# Patient Record
Sex: Male | Born: 1975 | Race: Black or African American | Hispanic: No | Marital: Married | Smoking: Current every day smoker
Health system: Southern US, Community
[De-identification: ages and names within clinical notes are randomized; demographics above are authoritative.]

## PROBLEM LIST (undated history)

## (undated) DIAGNOSIS — M25559 Pain in unspecified hip: Secondary | ICD-10-CM

## (undated) DIAGNOSIS — M1990 Unspecified osteoarthritis, unspecified site: Secondary | ICD-10-CM

## (undated) DIAGNOSIS — F17200 Nicotine dependence, unspecified, uncomplicated: Secondary | ICD-10-CM

## (undated) DIAGNOSIS — I10 Essential (primary) hypertension: Secondary | ICD-10-CM

## (undated) HISTORY — PX: SHOULDER ARTHROSCOPY: SHX128

## (undated) HISTORY — DX: Nicotine dependence, unspecified, uncomplicated: F17.200

## (undated) HISTORY — PX: PELVIS DEBRIDEMENT: SHX2195

## (undated) HISTORY — PX: HIP FRACTURE SURGERY: SHX118

## (undated) HISTORY — DX: Pain in unspecified hip: M25.559

## (undated) HISTORY — PX: ORTHOPEDIC SURGERY: SHX850

## (undated) HISTORY — DX: Unspecified osteoarthritis, unspecified site: M19.90

---

## 1998-06-20 ENCOUNTER — Emergency Department (HOSPITAL_COMMUNITY): Admission: EM | Admit: 1998-06-20 | Discharge: 1998-06-20 | Payer: Self-pay | Admitting: Emergency Medicine

## 1998-06-20 ENCOUNTER — Encounter: Payer: Self-pay | Admitting: Emergency Medicine

## 2006-07-31 ENCOUNTER — Emergency Department (HOSPITAL_COMMUNITY): Admission: EM | Admit: 2006-07-31 | Discharge: 2006-07-31 | Payer: Self-pay | Admitting: Emergency Medicine

## 2006-10-07 ENCOUNTER — Ambulatory Visit (HOSPITAL_COMMUNITY): Admission: RE | Admit: 2006-10-07 | Discharge: 2006-10-07 | Payer: Self-pay | Admitting: Orthopaedic Surgery

## 2009-11-27 ENCOUNTER — Emergency Department (HOSPITAL_COMMUNITY): Admission: EM | Admit: 2009-11-27 | Discharge: 2009-11-27 | Payer: Self-pay | Admitting: Emergency Medicine

## 2010-07-04 LAB — CULTURE, ROUTINE-ABSCESS

## 2012-01-20 ENCOUNTER — Encounter (HOSPITAL_COMMUNITY): Payer: Self-pay | Admitting: *Deleted

## 2012-01-20 ENCOUNTER — Emergency Department (HOSPITAL_COMMUNITY): Payer: Self-pay

## 2012-01-20 ENCOUNTER — Emergency Department (HOSPITAL_COMMUNITY)
Admission: EM | Admit: 2012-01-20 | Discharge: 2012-01-20 | Disposition: A | Discharge status: Home / Self Care | Payer: Self-pay | Attending: Emergency Medicine | Admitting: Emergency Medicine

## 2012-01-20 DIAGNOSIS — M25469 Effusion, unspecified knee: Secondary | ICD-10-CM | POA: Insufficient documentation

## 2012-01-20 DIAGNOSIS — M25569 Pain in unspecified knee: Secondary | ICD-10-CM | POA: Insufficient documentation

## 2012-01-20 DIAGNOSIS — M25461 Effusion, right knee: Secondary | ICD-10-CM

## 2012-01-20 MED ORDER — DICLOFENAC SODIUM 75 MG PO TBEC: 75.0000 mg | DELAYED_RELEASE_TABLET | Freq: Two times a day (BID) | ORAL | Status: AC

## 2012-01-20 MED ORDER — HYDROCODONE-ACETAMINOPHEN 5-325 MG PO TABS: ORAL_TABLET | ORAL | Status: DC

## 2012-01-20 NOTE — ED Provider Notes (Signed)
History     CSN: [id number]  Arrival date & time 01/20/12  0917   First MD Initiated Contact with Patient 01/20/12 [phone]      Chief Complaint  Patient presents with  . Knee Pain    (Consider location/radiation/quality/duration/timing/severity/associated sxs/prior treatment) HPI Comments: Patient states that approximately 2 weeks ago he had a fall from a standing position and injured the right knee. Shortly after this fall he had swelling involving the right knee. The patient states that he has tried wraps and ice and over-the-counter medications with minimal success. The patient presents now for evaluation of his knee and in particular to rule out fracture. 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  The history is provided by the patient.    History reviewed. No pertinent past medical history.  Past Surgical History  Procedure Date  . Orthopedic surgery     No family history on file.  History  Substance Use Topics  . Smoking status: Current Every Day Smoker    Types: Cigarettes  . Smokeless tobacco: Not on file  . Alcohol Use: Yes     daily      Review of Systems  Constitutional: Negative for activity change.       All ROS Neg except as noted in HPI  HENT: Negative for nosebleeds and neck pain.   Eyes: Negative for photophobia and discharge.  Respiratory: Negative for cough, shortness of breath and wheezing.   Cardiovascular: Negative for chest pain and palpitations.  Gastrointestinal: Negative for abdominal pain and blood in stool.  Genitourinary: Negative for dysuria, frequency and hematuria.  Musculoskeletal: Positive for back pain and arthralgias.  Skin: Negative.   Neurological: Negative for dizziness, seizures and speech difficulty.  Psychiatric/Behavioral: Negative for hallucinations and confusion.    Allergies  Review  of patient's allergies indicates no known allergies.  Home Medications   Current Outpatient Rx  Name Route Sig Dispense Refill  . ASPIRIN-CAFFEINE 845-65 MG PO PACK Oral Take 1 Package by mouth every 8 (eight) hours as needed. Pain    . DICLOFENAC SODIUM 75 MG PO TBEC Oral Take 1 tablet (75 mg total) by mouth 2 (two) times daily. 12 tablet 0    Please take with food  . HYDROCODONE-ACETAMINOPHEN 5-325 MG PO TABS  1 or 2 po q4h prn pain 15 tablet 0    BP 142/95  Pulse 62  Temp 99 F (37.2 C) (Oral)  Resp 16  Ht 6' (1.829 m)  Wt 197 lb (89.359 kg)  BMI 26.72 kg/m2  SpO2 100%  Physical Exam  Nursing note and vitals reviewed. Constitutional: He is oriented to person, place, and time. He appears well-developed and well-nourished.  Non-toxic appearance.  HENT:  Head: Normocephalic.  Right Ear: Tympanic membrane and external ear normal.  Left Ear: Tympanic membrane and external ear normal.  Eyes: EOM and lids are normal. Pupils are equal, round, and reactive to light.  Neck: Normal range of motion. Neck supple. Carotid bruit is not present.  Cardiovascular: Normal rate, regular rhythm, normal heart sounds, intact distal pulses and normal pulses.   Pulmonary/Chest: Breath sounds normal. No respiratory distress.  Abdominal: Soft. Bowel sounds are normal. There is no tenderness. There is no guarding.  Musculoskeletal: Normal range of motion.       There is soreness with  range of motion of the right hip. There is an effusion of the right knee. The right knee is warm but not hot. There is no posterior mass appreciated. The anterior tibial tuberosity is intact. There is no deformity of the quadricep area. There is no tibia or fibula deformity appreciated. Dorsalis pedis pulses are symmetrical.  Lymphadenopathy:       Head (right side): No submandibular adenopathy present.       Head (left side): No submandibular adenopathy present.    He has no cervical adenopathy.  Neurological: He is  alert and oriented to person, place, and time. He has normal strength. No cranial nerve deficit or sensory deficit.       There is decreased dorsiflexion on the right. This is not new per the patient.  Skin: Skin is warm and dry.  Psychiatric: He has a normal mood and affect. His speech is normal.    ED Course  Procedures (including critical care time)  Labs Reviewed - No data to display Dg Knee Complete 4 Views Right  01/20/2012  *RADIOLOGY REPORT*  Clinical Data: Right knee pain, swelling.  RIGHT KNEE - COMPLETE 4+ VIEW  Comparison: None.  Findings: There is a moderate joint effusion. No acute bony abnormality.  Specifically, no fracture, subluxation, or dislocation.  Soft tissues are intact.  Joint spaces are maintained.  IMPRESSION: Moderate joint effusion. No acute bony abnormality.   Original Report Authenticated By: Cyndie Chime, M.D.      1. Knee effusion, right       MDM  I have reviewed nursing notes, vital signs, and all appropriate lab and imaging results for this patient. X-ray of the right knee reveals a moderate effusion, there is no bony abnormality appreciated. Patient is fitted with a knee immobilizer, prescription is given for Voltaren 75 mg 12 times daily with food. The patient is also given a prescription for Norco 5 mg #15 tablets to use for pain if needed. Patient is referred to orthopedics, for evaluation of this problem.       Kathie Dike, [place] 01/20/12 [phone]

## 2012-01-20 NOTE — ED Provider Notes (Signed)
Medical screening examination/treatment/procedure(s) were performed by non-physician practitioner and as supervising physician I was immediately available for consultation/collaboration.   Spiro Ausborn, MD 01/20/12 1535 

## 2012-01-20 NOTE — ED Notes (Signed)
Fell x 2 weeks ago. Pain and swelling to right knee, per pt.

## 2012-10-26 DIAGNOSIS — Z87828 Personal history of other (healed) physical injury and trauma: Secondary | ICD-10-CM | POA: Insufficient documentation

## 2012-10-26 DIAGNOSIS — F17200 Nicotine dependence, unspecified, uncomplicated: Secondary | ICD-10-CM | POA: Insufficient documentation

## 2012-10-26 DIAGNOSIS — M25559 Pain in unspecified hip: Secondary | ICD-10-CM | POA: Insufficient documentation

## 2012-10-26 DIAGNOSIS — Z8739 Personal history of other diseases of the musculoskeletal system and connective tissue: Secondary | ICD-10-CM | POA: Insufficient documentation

## 2012-10-26 DIAGNOSIS — Z79899 Other long term (current) drug therapy: Secondary | ICD-10-CM | POA: Insufficient documentation

## 2012-10-26 DIAGNOSIS — Z9889 Other specified postprocedural states: Secondary | ICD-10-CM | POA: Insufficient documentation

## 2012-10-27 ENCOUNTER — Emergency Department (HOSPITAL_COMMUNITY)
Admission: EM | Admit: 2012-10-27 | Discharge: 2012-10-27 | Disposition: A | Discharge status: Home / Self Care | Payer: Self-pay | Attending: Emergency Medicine | Admitting: Emergency Medicine

## 2012-10-27 ENCOUNTER — Emergency Department (HOSPITAL_COMMUNITY): Payer: Self-pay

## 2012-10-27 ENCOUNTER — Encounter (HOSPITAL_COMMUNITY): Payer: Self-pay

## 2012-10-27 DIAGNOSIS — M25552 Pain in left hip: Secondary | ICD-10-CM

## 2012-10-27 MED ORDER — KETOROLAC TROMETHAMINE 60 MG/2ML IM SOLN
60.0000 mg | Freq: Once | INTRAMUSCULAR | Status: AC
  Administered 2012-10-27: 60 mg via INTRAMUSCULAR
  Filled 2012-10-27: qty 2

## 2012-10-27 MED ORDER — HYDROCODONE-ACETAMINOPHEN 5-325 MG PO TABS: 1.0000 | ORAL_TABLET | Freq: Four times a day (QID) | ORAL | Status: DC | PRN

## 2012-10-27 MED ORDER — NAPROXEN SODIUM 550 MG PO TABS: 550.0000 mg | ORAL_TABLET | Freq: Two times a day (BID) | ORAL | Status: DC

## 2012-10-27 MED ORDER — CYCLOBENZAPRINE HCL 5 MG PO TABS: 5.0000 mg | ORAL_TABLET | Freq: Three times a day (TID) | ORAL | Status: DC | PRN
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
## 2012-10-27 NOTE — ED Notes (Signed)
Pt c/o left hip pain for 4 days, denies recent injury, states he thinks he had a hip replacement about 20 years ago.
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
## 2012-10-27 NOTE — ED Notes (Signed)
Pt alert & oriented x4. Patient given discharge instructions, paperwork & prescription(s). Patient instructed to stop at the registration desk to finish any additional paperwork. Patient verbalized understanding. Pt left department w/ no further questions. 

## 2013-05-03 ENCOUNTER — Encounter (HOSPITAL_COMMUNITY): Payer: Self-pay | Admitting: Emergency Medicine

## 2013-05-03 ENCOUNTER — Emergency Department (HOSPITAL_COMMUNITY)
Admission: EM | Admit: 2013-05-03 | Discharge: 2013-05-03 | Disposition: A | Discharge status: Home / Self Care | Payer: Medicaid Other | Attending: Emergency Medicine | Admitting: Emergency Medicine

## 2013-05-03 DIAGNOSIS — R209 Unspecified disturbances of skin sensation: Secondary | ICD-10-CM | POA: Insufficient documentation

## 2013-05-03 DIAGNOSIS — M169 Osteoarthritis of hip, unspecified: Secondary | ICD-10-CM

## 2013-05-03 DIAGNOSIS — IMO0002 Reserved for concepts with insufficient information to code with codable children: Secondary | ICD-10-CM | POA: Insufficient documentation

## 2013-05-03 DIAGNOSIS — F17200 Nicotine dependence, unspecified, uncomplicated: Secondary | ICD-10-CM | POA: Insufficient documentation

## 2013-05-03 DIAGNOSIS — M216X9 Other acquired deformities of unspecified foot: Secondary | ICD-10-CM | POA: Insufficient documentation

## 2013-05-03 DIAGNOSIS — G8929 Other chronic pain: Secondary | ICD-10-CM | POA: Insufficient documentation

## 2013-05-03 DIAGNOSIS — Z9889 Other specified postprocedural states: Secondary | ICD-10-CM | POA: Insufficient documentation

## 2013-05-03 DIAGNOSIS — M1610 Unilateral primary osteoarthritis, unspecified hip: Secondary | ICD-10-CM | POA: Insufficient documentation

## 2013-05-03 DIAGNOSIS — Z791 Long term (current) use of non-steroidal anti-inflammatories (NSAID): Secondary | ICD-10-CM | POA: Insufficient documentation

## 2013-05-03 MED ORDER — DEXAMETHASONE 6 MG PO TABS: ORAL_TABLET | ORAL | Status: DC

## 2013-05-03 MED ORDER — DEXAMETHASONE SODIUM PHOSPHATE 4 MG/ML IJ SOLN
8.0000 mg | Freq: Once | INTRAMUSCULAR | Status: AC
  Administered 2013-05-03: 8 mg via INTRAMUSCULAR
  Filled 2013-05-03: qty 2

## 2013-05-03 MED ORDER — DICLOFENAC SODIUM 75 MG PO TBEC: 75.0000 mg | DELAYED_RELEASE_TABLET | Freq: Two times a day (BID) | ORAL | Status: AC

## 2013-05-03 MED ORDER — KETOROLAC TROMETHAMINE 60 MG/2ML IM SOLN
60.0000 mg | Freq: Once | INTRAMUSCULAR | Status: AC
  Administered 2013-05-03: 60 mg via INTRAMUSCULAR
  Filled 2013-05-03: qty 2

## 2013-05-03 NOTE — ED Provider Notes (Signed)
CSN: [id number]     Arrival date & time 05/03/13  1156 History   First MD Initiated Contact with Patient 05/03/13 1300     Chief Complaint  Patient presents with  . Hip Pain   (Consider location/radiation/quality/duration/timing/severity/associated sxs/prior Treatment) Patient is a 38 y.o. male presenting with hip pain. The history is provided by the patient.  Hip Pain This is a chronic problem. Episode onset: chronic hip pain, but worse for the past week. The problem occurs daily. The problem has been gradually worsening. Associated symptoms include arthralgias and numbness. Pertinent negatives include no abdominal pain, chest pain, chills, coughing, fever or neck pain. The symptoms are aggravated by standing and walking. He has tried acetaminophen for the symptoms. The treatment provided no relief.    History reviewed. No pertinent past medical history. Past Surgical History  Procedure Laterality Date  . Orthopedic surgery    . Hip fracture surgery     History reviewed. No pertinent family history. History  Substance Use Topics  . Smoking status: Current Every Day Smoker    Types: Cigarettes  . Smokeless tobacco: Not on file  . Alcohol Use: Yes     Comment: daily-beer    Review of Systems  Constitutional: Negative for fever, chills and activity change.       All ROS Neg except as noted in HPI  HENT: Negative for nosebleeds.   Eyes: Negative for photophobia and discharge.  Respiratory: Negative for cough, shortness of breath and wheezing.   Cardiovascular: Negative for chest pain and palpitations.  Gastrointestinal: Negative for abdominal pain and blood in stool.  Genitourinary: Negative for dysuria, frequency and hematuria.  Musculoskeletal: Positive for arthralgias. Negative for back pain and neck pain.  Skin: Negative.   Neurological: Positive for numbness. Negative for dizziness, seizures and speech difficulty.  Psychiatric/Behavioral: Negative for hallucinations and  confusion.    Allergies  Review of patient's allergies indicates no known allergies.  Home Medications   Current Outpatient Rx  Name  Route  Sig  Dispense  Refill  . acetaminophen (TYLENOL) 500 MG tablet   Oral   Take 1,000 mg by mouth every 6 (six) hours as needed for moderate pain.         Marland Kitchen dexamethasone (DECADRON) 6 MG tablet      1 po bid with food   12 tablet   0   . diclofenac (VOLTAREN) 75 MG EC tablet   Oral   Take 1 tablet (75 mg total) by mouth 2 (two) times daily.   12 tablet   0    BP 140/92  Pulse 61  Temp(Src) 98.5 F (36.9 C) (Oral)  Resp 20  Ht 6' (1.829 m)  Wt 190 lb (86.183 kg)  BMI 25.76 kg/m2  SpO2 98% Physical Exam  Nursing note and vitals reviewed. Constitutional: He is oriented to person, place, and time. He appears well-developed and well-nourished.  Non-toxic appearance.  HENT:  Head: Normocephalic.  Right Ear: Tympanic membrane and external ear normal.  Left Ear: Tympanic membrane and external ear normal.  Eyes: EOM and lids are normal. Pupils are equal, round, and reactive to light.  Neck: Normal range of motion. Neck supple. Carotid bruit is not present.  Cardiovascular: Normal rate, regular rhythm, normal heart sounds, intact distal pulses and normal pulses.   Pulmonary/Chest: Breath sounds normal. No respiratory distress.  Abdominal: Soft. Bowel sounds are normal. There is no tenderness. There is no guarding.  Musculoskeletal: Normal range of motion.  There is  pain and decreased range of motion of the left hip. The hip area is not hot. There is crepitus present. There is no increased redness or red streaking from the hip area. There is good range of motion of the right and left knee. Some crepitus present.   Lymphadenopathy:       Head (right side): No submandibular adenopathy present.       Head (left side): No submandibular adenopathy present.    He has no cervical adenopathy.  Neurological: He is alert and oriented to person,  place, and time. He has normal strength. No cranial nerve deficit or sensory deficit.  There is some foot drop noted, left more than right. This is not new according to the patient.  Skin: Skin is warm and dry.  Psychiatric: He has a normal mood and affect. His speech is normal.    ED Course  Procedures (including critical care time) Labs Review Labs Reviewed - No data to display Imaging Review No results found.  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 He has degenerative changes noted of the left hip. He's been evaluated in the emergency department for similar problems in the past. No evidence for septic joint. No evidence for dislocation.  Patient given an injection of Decadron and Toradol in the emergency department. Prescription for Voltaren 75 mg given to the patient. Patient advised to [facility], which is where he had his surgery done initially, for followup and management.  Kathie DikeHobson M Daneya Hartgrove, PA-C 05/03/13 [phone]

## 2013-05-03 NOTE — ED Provider Notes (Signed)
Medical screening examination/treatment/procedure(s) were performed by non-physician practitioner and as supervising physician I was immediately available for consultation/collaboration.  EKG Interpretation   None         Glynn OctaveStephen Labarron Durnin, MD 05/03/13 1528

## 2013-05-03 NOTE — ED Notes (Signed)
Chronic left hip pain x 1 week. Was seen here over summer but states has not gotten an answer of whats wrong. Pt using crutches at this time. NAD.

## 2013-05-03 NOTE — Discharge Instructions (Signed)
Please see your physicians at the [facility] for reevaluation of your hip as well as the pelvis area where they performed the various operations. Please use diclofenac and Decadron daily with food. Heat to the hip area maybe helpful.

## 2013-10-26 ENCOUNTER — Encounter: Payer: Self-pay | Admitting: Podiatry

## 2013-11-14 ENCOUNTER — Ambulatory Visit (INDEPENDENT_AMBULATORY_CARE_PROVIDER_SITE_OTHER): Payer: Medicaid Other

## 2013-11-14 ENCOUNTER — Ambulatory Visit (INDEPENDENT_AMBULATORY_CARE_PROVIDER_SITE_OTHER): Payer: Medicaid Other | Admitting: Podiatry

## 2013-11-14 ENCOUNTER — Encounter: Payer: Self-pay | Admitting: Podiatry

## 2013-11-14 VITALS — BP 143/92 | HR 63 | Resp 16 | Ht 72.0 in | Wt 213.0 lb

## 2013-11-14 DIAGNOSIS — M779 Enthesopathy, unspecified: Secondary | ICD-10-CM

## 2013-11-14 DIAGNOSIS — M7750 Other enthesopathy of unspecified foot: Secondary | ICD-10-CM

## 2013-11-14 DIAGNOSIS — L84 Corns and callosities: Secondary | ICD-10-CM

## 2013-11-14 DIAGNOSIS — M2040 Other hammer toe(s) (acquired), unspecified foot: Secondary | ICD-10-CM

## 2013-11-14 NOTE — Progress Notes (Signed)
   Subjective:    Patient ID: Logan Mcbride, male    DOB: [birth year], 38 y.o.   MRN: [id number]  HPI Comments: i have a callus on both feet and corns on my little toes on both feet. i have had these problems since December 2014. My feet remained the same. Walking and pressure hurts my feet. i soak my feet in epsom salt, trim the callus with a razor, and that's it.  Foot Pain      Review of Systems  Cardiovascular: Positive for leg swelling.       Calf pain  Musculoskeletal:       Joint pain Back pain Difficulty walking   All other systems reviewed and are negative.      Objective:   Physical Exam        Assessment & Plan:

## 2013-11-14 NOTE — Progress Notes (Signed)
Subjective:     Patient ID: Logan Mcbride, male   DOB: 1975-10-01, 38 y.o.   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      Objective:   Physical Exam  Nursing note and vitals reviewed. Constitutional: He is oriented to person, place, and time.  Cardiovascular: Intact distal pulses.   Musculoskeletal: Normal range of motion.  Neurological: He is oriented to person, place, and time.  Skin: Skin is warm and dry.   neurovascular status was found to be intact with range of motion severely diminished on the left side secondary to previous fusions and fixed equinus condition left with severe keratotic lesion proximal phalanx fifth toe right third toe right distal and underneath the fifth metatarsal with fluid buildup. Digits are well-perfused with no other pathology     Assessment:     Severe foot structural issues some of which we may be a with a helped and others which there is nothing were tibial to help with    Plan:     H&P and x-rays reviewed with patient. At this point deep debridement was accomplished and I did discuss that I could correct the third and fifth toes on the right foot but do not see that the other ones were to be able to help with. He will reappoint as needed

## 2014-07-26 IMAGING — CR DG HIP (WITH OR WITHOUT PELVIS) 2-3V*L*
3 series · 3 of 3 positions shown · non-contrast
Comparison: None.

CLINICAL DATA: Left hip pain since October 21, 2012, becoming more
severe recently.  The patient not now able to walk .

LEFT HIP - COMPLETE 2+ VIEW

[view not recorded (1 of 3)]
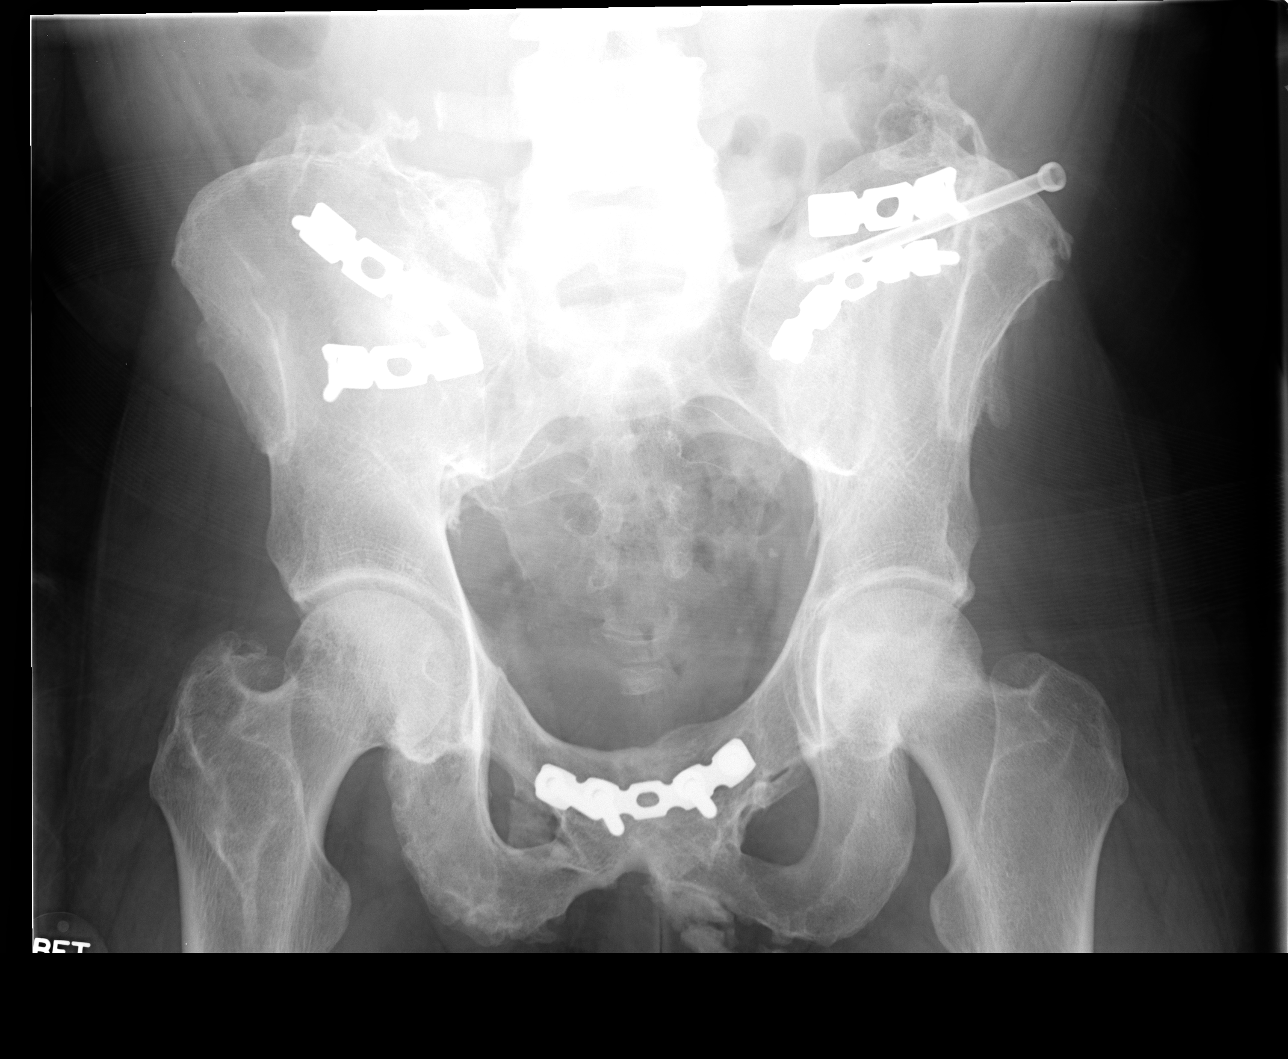

[view not recorded (2 of 3)]
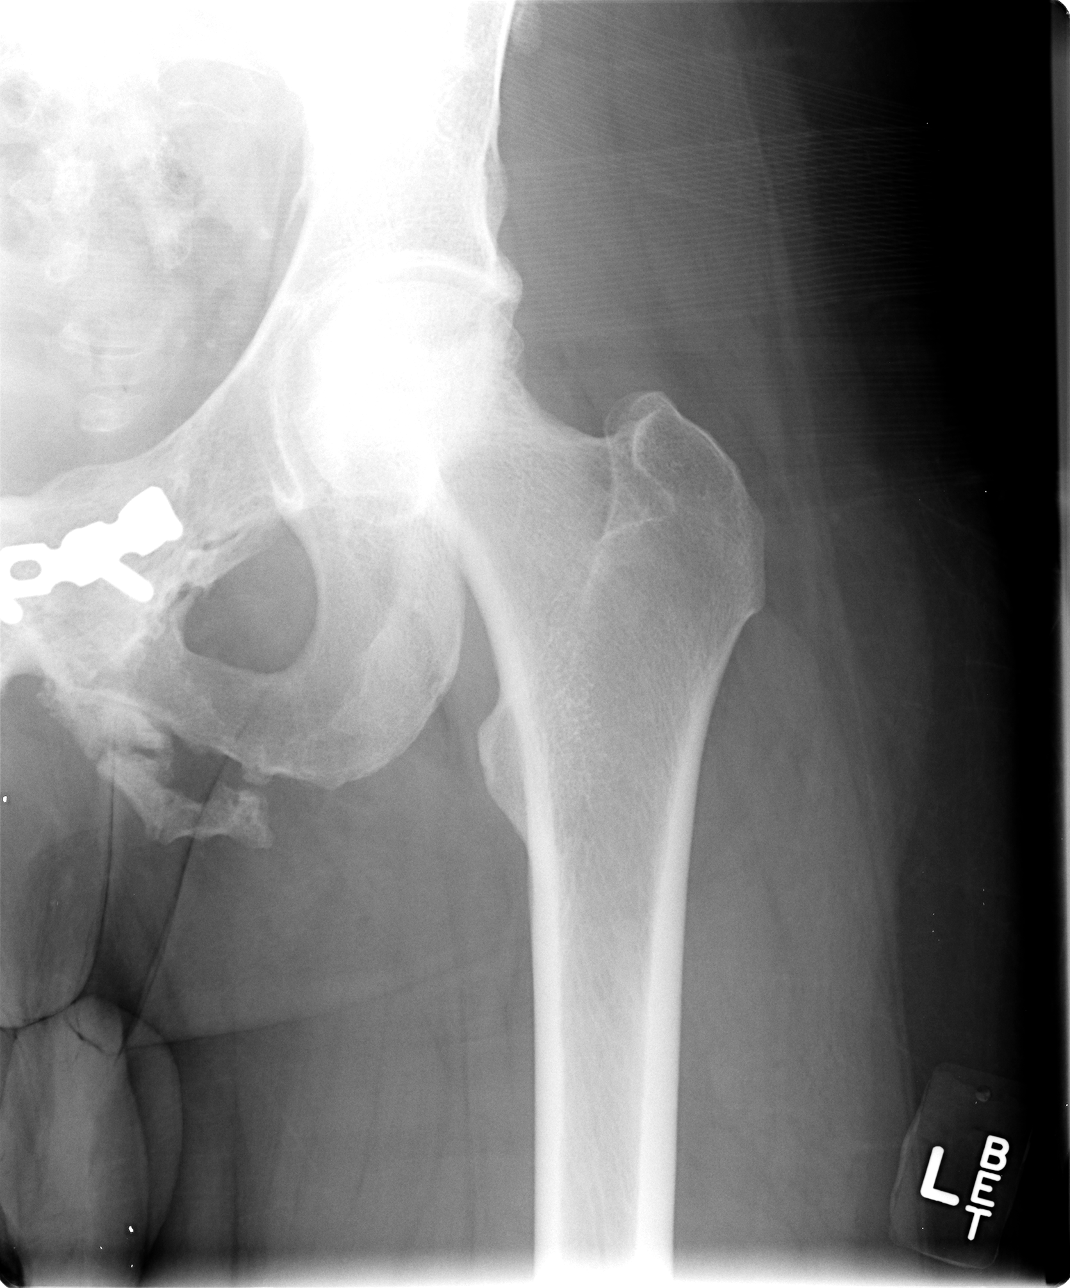

[view not recorded (3 of 3)]
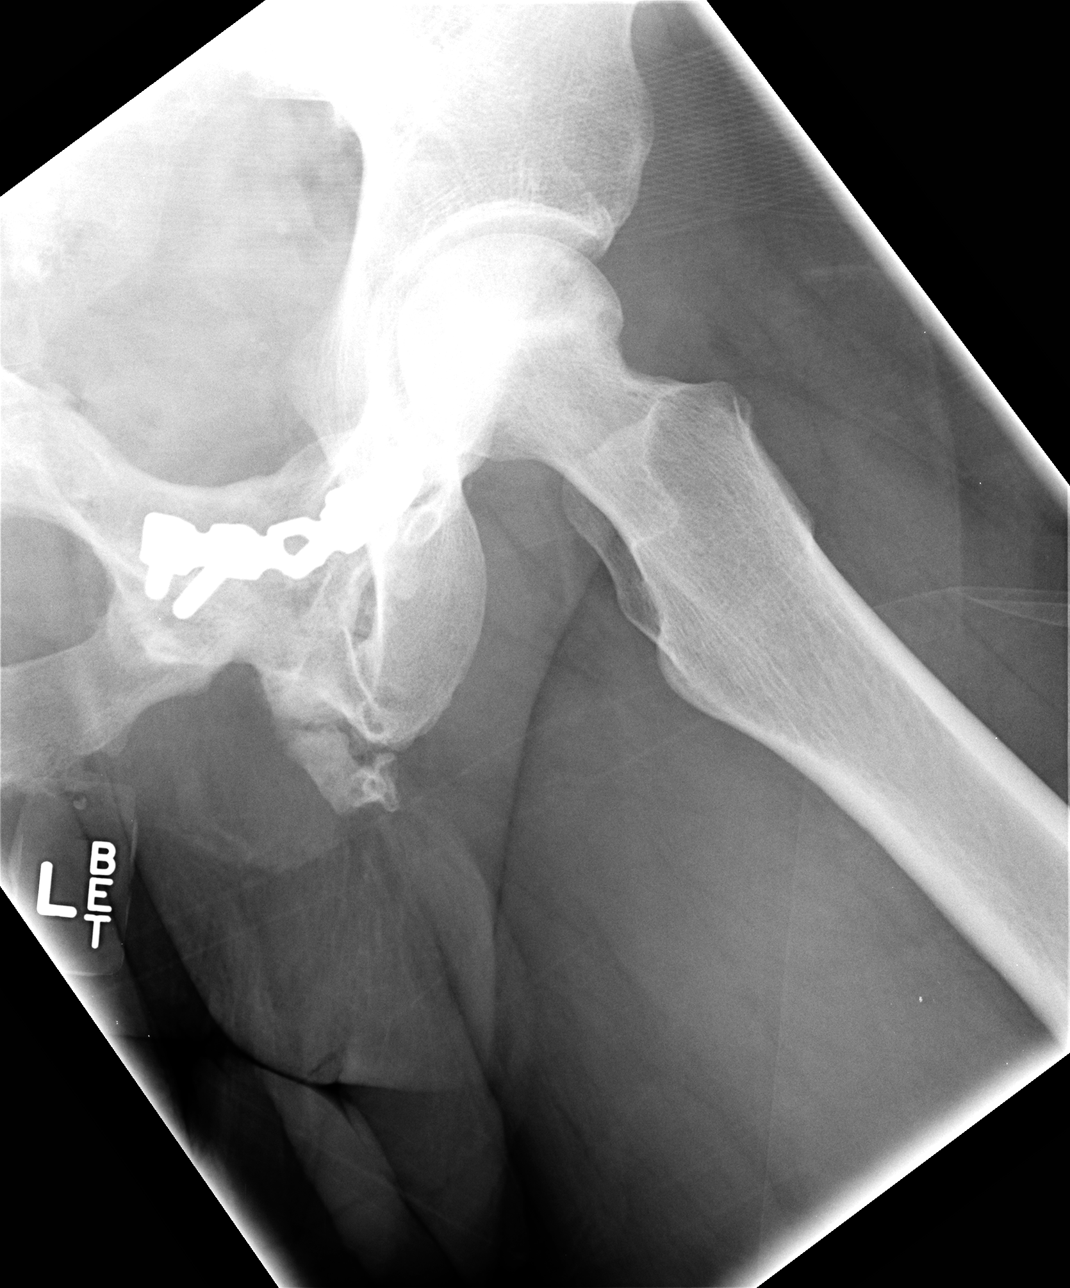

[3 of 3 positions shown; findings below may reference images not displayed]

FINDINGS: Plate screw fixation of old healed fractures of the SI
joints and symphysis pubis with screw fixation of the left SI
joint.  Productive bone changes consistent with bone healing.  Mild
heterotopic ossification inferior to the left inferior pubic ramus.
Degenerative changes in the lower lumbar spine and left hip.  No
acute fracture or subluxation is identified.  No expansile bone
lesions.  No radiopaque soft tissue foreign bodies.
IMPRESSION: Postoperative changes consistent with internal fixation of old
healed fractures of the symphysis pubis and SI joints bilaterally.
Heterotopic ossification is present.  Degenerative changes in the
hips.  No acute fractures identified.

## 2014-12-26 ENCOUNTER — Encounter
Admission: RE | Admit: 2014-12-26 | Discharge: 2014-12-26 | Disposition: A | Discharge status: Home / Self Care | Payer: Medicaid Other | Source: Ambulatory Visit | Attending: Orthopedic Surgery | Admitting: Orthopedic Surgery

## 2014-12-26 DIAGNOSIS — Z01812 Encounter for preprocedural laboratory examination: Secondary | ICD-10-CM | POA: Insufficient documentation

## 2014-12-26 DIAGNOSIS — Z01810 Encounter for preprocedural cardiovascular examination: Secondary | ICD-10-CM | POA: Diagnosis not present

## 2014-12-26 HISTORY — DX: Essential (primary) hypertension: I10

## 2014-12-26 LAB — URINALYSIS COMPLETE WITH MICROSCOPIC (ARMC ONLY)
BACTERIA UA: NONE SEEN
Bilirubin Urine: NEGATIVE
Glucose, UA: NEGATIVE mg/dL
Ketones, ur: NEGATIVE mg/dL
Leukocytes, UA: NEGATIVE
Nitrite: NEGATIVE
PH: 5 (ref 5.0–8.0)
PROTEIN: 30 mg/dL — AB
Specific Gravity, Urine: 1.026 (ref 1.005–1.030)

## 2014-12-26 LAB — CBC
HCT: 44.4 % (ref 40.0–52.0)
HEMOGLOBIN: 15.1 g/dL (ref 13.0–18.0)
MCH: 31.4 pg (ref 26.0–34.0)
MCHC: 34 g/dL (ref 32.0–36.0)
MCV: 92.4 fL (ref 80.0–100.0)
Platelets: 168 10*3/uL (ref 150–440)
RBC: 4.81 MIL/uL (ref 4.40–5.90)
RDW: 13.6 % (ref 11.5–14.5)
WBC: 10.7 10*3/uL — AB (ref 3.8–10.6)

## 2014-12-26 LAB — TYPE AND SCREEN
ABO/RH(D): O POS
Antibody Screen: NEGATIVE

## 2014-12-26 LAB — PROTIME-INR
INR: 0.9
PROTHROMBIN TIME: 12.4 s (ref 11.4–15.0)

## 2014-12-26 LAB — BASIC METABOLIC PANEL
Anion gap: 8 (ref 5–15)
BUN: 11 mg/dL (ref 6–20)
CO2: 28 mmol/L (ref 22–32)
CREATININE: 0.94 mg/dL (ref 0.61–1.24)
Calcium: 9.1 mg/dL (ref 8.9–10.3)
Chloride: 106 mmol/L (ref 101–111)
GFR calc Af Amer: >60 mL/min (ref >60)
Glucose, Bld: 97 mg/dL (ref 65–99)
POTASSIUM: 3.2 mmol/L — AB (ref 3.5–5.1)
SODIUM: 142 mmol/L (ref 135–145)

## 2014-12-26 LAB — SEDIMENTATION RATE: SED RATE: 6 mm/h (ref 0–16)

## 2014-12-26 LAB — SURGICAL PCR SCREEN
MRSA, PCR: NEGATIVE
STAPHYLOCOCCUS AUREUS: POSITIVE — AB

## 2014-12-26 LAB — ABO/RH: ABO/RH(D): O POS

## 2014-12-26 LAB — APTT: aPTT: 26 seconds (ref 24–36)

## 2014-12-26 NOTE — Pre-Procedure Instructions (Signed)
Called and faxed results of staphylococcus aureus and MRSA nasal swab and potassium results with request for potassium supplement.

## 2014-12-26 NOTE — Patient Instructions (Signed)
  Your procedure is scheduled on: 01/08/15 Tues   Report to Day Surgery. To find out your arrival time please call [phone] between 1PM - 3PM on 01/07/15 Mon.  Remember: Instructions that are not followed completely may result in serious medical risk, up to and including death, or upon the discretion of your surgeon and anesthesiologist your surgery may need to be rescheduled.    _x__ 1. Do not eat food or drink liquids after midnight. No gum chewing or hard candies.     _xx___ 2. No Alcohol for 24 hours before or after surgery.   ____ 3. Bring all medications with you on the day of surgery if instructed.    _x___ 4. Notify your doctor if there is any change in your medical condition     (cold, fever, infections).     Do not wear jewelry, make-up, hairpins, clips or nail polish.  Do not wear lotions, powders, or perfumes. You may wear deodorant.  Do not shave 48 hours prior to surgery. Men may shave face and neck.  Do not bring valuables to the hospital.    [facility] is not responsible for any belongings or valuables.               Contacts, dentures or bridgework may not be worn into surgery.  Leave your suitcase in the car. After surgery it may be brought to your room.  For patients admitted to the hospital, discharge time is determined by your                treatment team.   Patients discharged the day of surgery will not be allowed to drive home.   Please read over the following fact sheets that you were given:   MRSA Information   ____ Take these medicines the morning of surgery with A SIP OF WATER:    1.cyclobenzaprine (FLEXERIL) 10 MG tablet  2.   3.   4.  5.  6.  ____ Fleet Enema (as directed)   _x___ Use CHG Soap as directed  ____ Use inhalers on the day of surgery  ____ Stop metformin 2 days prior to surgery    ____ Take 1/2 of usual insulin dose the night before surgery and none on the morning of surgery.   ____ Stop Coumadin/Plavix/aspirin on    _x___ Stop Anti-inflammatories on Stop Mobic 1 week before surgery   ____ Stop supplements until after surgery.    ____ Bring C-Pap to the hospital.

## 2014-12-31 ENCOUNTER — Encounter
Admission: RE | Admit: 2014-12-31 | Discharge: 2014-12-31 | Disposition: A | Discharge status: Home / Self Care | Payer: Medicaid Other | Source: Ambulatory Visit | Attending: Anesthesiology | Admitting: Anesthesiology

## 2014-12-31 ENCOUNTER — Other Ambulatory Visit: Payer: Medicaid Other

## 2014-12-31 DIAGNOSIS — Z01810 Encounter for preprocedural cardiovascular examination: Secondary | ICD-10-CM | POA: Diagnosis present

## 2015-01-02 ENCOUNTER — Other Ambulatory Visit: Payer: Medicaid Other

## 2015-01-08 ENCOUNTER — Encounter
Admission: RE | Disposition: A | Discharge status: Home Health | Payer: Self-pay | Source: Ambulatory Visit | Attending: Orthopedic Surgery

## 2015-01-08 ENCOUNTER — Inpatient Hospital Stay: Payer: Medicaid Other

## 2015-01-08 ENCOUNTER — Inpatient Hospital Stay: Payer: Medicaid Other | Admitting: Anesthesiology

## 2015-01-08 ENCOUNTER — Inpatient Hospital Stay
Admission: RE | Admit: 2015-01-08 | Discharge: 2015-01-10 | DRG: 470 | Disposition: A | Discharge status: Home Health | Payer: Medicaid Other | Source: Ambulatory Visit | Attending: Orthopedic Surgery | Admitting: Orthopedic Surgery

## 2015-01-08 DIAGNOSIS — Z8249 Family history of ischemic heart disease and other diseases of the circulatory system: Secondary | ICD-10-CM | POA: Diagnosis not present

## 2015-01-08 DIAGNOSIS — M87052 Idiopathic aseptic necrosis of left femur: Principal | ICD-10-CM | POA: Diagnosis present

## 2015-01-08 DIAGNOSIS — M1612 Unilateral primary osteoarthritis, left hip: Secondary | ICD-10-CM | POA: Diagnosis present

## 2015-01-08 DIAGNOSIS — F17200 Nicotine dependence, unspecified, uncomplicated: Secondary | ICD-10-CM | POA: Diagnosis present

## 2015-01-08 DIAGNOSIS — M87852 Other osteonecrosis, left femur: Secondary | ICD-10-CM | POA: Diagnosis present

## 2015-01-08 DIAGNOSIS — G8918 Other acute postprocedural pain: Secondary | ICD-10-CM

## 2015-01-08 DIAGNOSIS — M87059 Idiopathic aseptic necrosis of unspecified femur: Secondary | ICD-10-CM | POA: Diagnosis present

## 2015-01-08 DIAGNOSIS — Z419 Encounter for procedure for purposes other than remedying health state, unspecified: Secondary | ICD-10-CM

## 2015-01-08 HISTORY — PX: TOTAL HIP ARTHROPLASTY: SHX124

## 2015-01-08 LAB — CBC
HCT: 38.3 % — ABNORMAL LOW (ref 40.0–52.0)
Hemoglobin: 13.1 g/dL (ref 13.0–18.0)
MCH: 31.4 pg (ref 26.0–34.0)
MCHC: 34.1 g/dL (ref 32.0–36.0)
MCV: 92.2 fL (ref 80.0–100.0)
PLATELETS: 147 10*3/uL — AB (ref 150–440)
RBC: 4.16 MIL/uL — AB (ref 4.40–5.90)
RDW: 13.2 % (ref 11.5–14.5)
WBC: 18.3 10*3/uL — AB (ref 3.8–10.6)

## 2015-01-08 LAB — POCT I-STAT 4, (NA,K, GLUC, HGB,HCT)
GLUCOSE: 90 mg/dL (ref 65–99)
HCT: 43 % (ref 39.0–52.0)
HEMOGLOBIN: 14.6 g/dL (ref 13.0–17.0)
POTASSIUM: 3.8 mmol/L (ref 3.5–5.1)
Sodium: 140 mmol/L (ref 135–145)

## 2015-01-08 LAB — CREATININE, SERUM
CREATININE: 0.88 mg/dL (ref 0.61–1.24)
GFR calc Af Amer: >60 mL/min (ref >60)
GFR calc non Af Amer: >60 mL/min (ref >60)

## 2015-01-08 SURGERY — ARTHROPLASTY, HIP, TOTAL, ANTERIOR APPROACH
Anesthesia: Spinal | Site: Hip | Laterality: Left | Wound class: Clean

## 2015-01-08 MED ORDER — NICOTINE 14 MG/24HR TD PT24
14.0000 mg | MEDICATED_PATCH | Freq: Every day | TRANSDERMAL | Status: DC
  Administered 2015-01-08 – 2015-01-09 (×2): 14 mg via TRANSDERMAL
  Filled 2015-01-08 (×3): qty 1

## 2015-01-08 MED ORDER — LORAZEPAM 2 MG PO TABS
2.0000 mg | ORAL_TABLET | Freq: Four times a day (QID) | ORAL | Status: DC
  Administered 2015-01-08 – 2015-01-10 (×7): 2 mg via ORAL
  Filled 2015-01-08 (×8): qty 1

## 2015-01-08 MED ORDER — ONDANSETRON HCL 4 MG PO TABS: 4.0000 mg | ORAL_TABLET | Freq: Four times a day (QID) | ORAL | Status: DC | PRN

## 2015-01-08 MED ORDER — DEXTROSE-NACL 5-0.9 % IV SOLN
INTRAVENOUS | Status: DC
Start: 2015-01-08 — End: 2015-01-10
  Administered 2015-01-08 – 2015-01-09 (×4): via INTRAVENOUS

## 2015-01-08 MED ORDER — HYDROMORPHONE HCL 1 MG/ML IJ SOLN
1.0000 mg | Freq: Once | INTRAMUSCULAR | Status: AC
  Administered 2015-01-08: 1 mg via INTRAVENOUS
  Filled 2015-01-08: qty 1

## 2015-01-08 MED ORDER — ACETAMINOPHEN 325 MG PO TABS: 650.0000 mg | ORAL_TABLET | Freq: Four times a day (QID) | ORAL | Status: DC | PRN

## 2015-01-08 MED ORDER — METOCLOPRAMIDE HCL 5 MG/ML IJ SOLN: 5.0000 mg | Freq: Three times a day (TID) | INTRAMUSCULAR | Status: DC | PRN

## 2015-01-08 MED ORDER — LIDOCAINE HCL (PF) 2 % IJ SOLN
INTRAMUSCULAR | Status: DC | PRN
  Administered 2015-01-08: 50 mg

## 2015-01-08 MED ORDER — CEFAZOLIN SODIUM-DEXTROSE 2-3 GM-% IV SOLR
2.0000 g | Freq: Four times a day (QID) | INTRAVENOUS | Status: AC
  Administered 2015-01-08 – 2015-01-09 (×3): 2 g via INTRAVENOUS
  Filled 2015-01-08 (×3): qty 50

## 2015-01-08 MED ORDER — MAGNESIUM CITRATE PO SOLN: 1.0000 | Freq: Once | ORAL | Status: DC | PRN

## 2015-01-08 MED ORDER — PROPOFOL INFUSION 10 MG/ML OPTIME
INTRAVENOUS | Status: DC | PRN
  Administered 2015-01-08: 75 ug/kg/min via INTRAVENOUS

## 2015-01-08 MED ORDER — ZOLPIDEM TARTRATE 5 MG PO TABS: 5.0000 mg | ORAL_TABLET | Freq: Every evening | ORAL | Status: DC | PRN

## 2015-01-08 MED ORDER — BUPIVACAINE HCL (PF) 0.5 % IJ SOLN
INTRAMUSCULAR | Status: DC | PRN
  Administered 2015-01-08: 3 mL via INTRATHECAL

## 2015-01-08 MED ORDER — FENTANYL CITRATE (PF) 100 MCG/2ML IJ SOLN: 25.0000 ug | INTRAMUSCULAR | Status: DC | PRN

## 2015-01-08 MED ORDER — FAMOTIDINE 20 MG PO TABS
20.0000 mg | ORAL_TABLET | Freq: Once | ORAL | Status: AC
  Administered 2015-01-08: 20 mg via ORAL

## 2015-01-08 MED ORDER — MORPHINE SULFATE (PF) 2 MG/ML IV SOLN
2.0000 mg | INTRAVENOUS | Status: DC | PRN
  Administered 2015-01-08 – 2015-01-09 (×7): 2 mg via INTRAVENOUS
  Filled 2015-01-08 (×7): qty 1

## 2015-01-08 MED ORDER — CYCLOBENZAPRINE HCL 10 MG PO TABS
10.0000 mg | ORAL_TABLET | Freq: Three times a day (TID) | ORAL | Status: DC | PRN
  Administered 2015-01-09: 10 mg via ORAL
  Filled 2015-01-08: qty 1

## 2015-01-08 MED ORDER — ALUM & MAG HYDROXIDE-SIMETH 200-200-20 MG/5ML PO SUSP: 30.0000 mL | ORAL | Status: DC | PRN

## 2015-01-08 MED ORDER — ACETAMINOPHEN 650 MG RE SUPP: 650.0000 mg | Freq: Four times a day (QID) | RECTAL | Status: DC | PRN

## 2015-01-08 MED ORDER — ONDANSETRON HCL 4 MG/2ML IJ SOLN: 4.0000 mg | Freq: Once | INTRAMUSCULAR | Status: DC | PRN

## 2015-01-08 MED ORDER — DOCUSATE SODIUM 100 MG PO CAPS
100.0000 mg | ORAL_CAPSULE | Freq: Two times a day (BID) | ORAL | Status: DC
  Administered 2015-01-08 – 2015-01-10 (×4): 100 mg via ORAL
  Filled 2015-01-08 (×4): qty 1

## 2015-01-08 MED ORDER — ACETAMINOPHEN 500 MG PO TABS
1000.0000 mg | ORAL_TABLET | Freq: Four times a day (QID) | ORAL | Status: AC
  Administered 2015-01-08 – 2015-01-09 (×4): 1000 mg via ORAL
  Filled 2015-01-08 (×4): qty 2

## 2015-01-08 MED ORDER — BUPIVACAINE-EPINEPHRINE 0.25% -1:200000 IJ SOLN
INTRAMUSCULAR | Status: DC | PRN
  Administered 2015-01-08: 30 mL

## 2015-01-08 MED ORDER — ENOXAPARIN SODIUM 40 MG/0.4ML ~~LOC~~ SOLN
40.0000 mg | SUBCUTANEOUS | Status: DC
  Administered 2015-01-09 – 2015-01-10 (×2): 40 mg via SUBCUTANEOUS
  Filled 2015-01-08 (×2): qty 0.4

## 2015-01-08 MED ORDER — BISACODYL 10 MG RE SUPP: 10.0000 mg | Freq: Every day | RECTAL | Status: DC | PRN

## 2015-01-08 MED ORDER — MIDAZOLAM HCL 5 MG/5ML IJ SOLN
INTRAMUSCULAR | Status: DC | PRN
  Administered 2015-01-08: 2 mg via INTRAVENOUS
  Administered 2015-01-08 (×2): 1 mg via INTRAVENOUS

## 2015-01-08 MED ORDER — HYDROMORPHONE HCL 1 MG/ML IJ SOLN
1.0000 mg | INTRAMUSCULAR | Status: AC
  Administered 2015-01-08: 1 mg via INTRAVENOUS
  Filled 2015-01-08: qty 1

## 2015-01-08 MED ORDER — MAGNESIUM HYDROXIDE 400 MG/5ML PO SUSP: 30.0000 mL | Freq: Every day | ORAL | Status: DC | PRN

## 2015-01-08 MED ORDER — CEFAZOLIN SODIUM-DEXTROSE 2-3 GM-% IV SOLR
2.0000 g | Freq: Once | INTRAVENOUS | Status: AC
  Administered 2015-01-08: 2 g via INTRAVENOUS

## 2015-01-08 MED ORDER — GLYCOPYRROLATE 0.2 MG/ML IJ SOLN
INTRAMUSCULAR | Status: DC | PRN
  Administered 2015-01-08: 0.2 mg via INTRAVENOUS

## 2015-01-08 MED ORDER — PHENOL 1.4 % MT LIQD: 1.0000 | OROMUCOSAL | Status: DC | PRN

## 2015-01-08 MED ORDER — SPIRITUS FRUMENTI
1.0000 | Freq: Every day | ORAL | Status: DC
  Administered 2015-01-08 – 2015-01-09 (×2): 1 via ORAL
  Filled 2015-01-08 (×8): qty 1

## 2015-01-08 MED ORDER — FAMOTIDINE 20 MG PO TABS
ORAL_TABLET | ORAL | Status: AC
  Administered 2015-01-08: 20 mg via ORAL
  Filled 2015-01-08: qty 1

## 2015-01-08 MED ORDER — NEOMYCIN-POLYMYXIN B GU 40-200000 IR SOLN
Status: DC | PRN
  Administered 2015-01-08: 4 mL

## 2015-01-08 MED ORDER — TRANEXAMIC ACID 1000 MG/10ML IV SOLN
1000.0000 mg | INTRAVENOUS | Status: AC
  Administered 2015-01-08: 1000 mg via INTRAVENOUS
  Filled 2015-01-08: qty 10

## 2015-01-08 MED ORDER — OXYCODONE HCL 5 MG PO TABS
5.0000 mg | ORAL_TABLET | ORAL | Status: DC | PRN
  Administered 2015-01-08: 5 mg via ORAL
  Administered 2015-01-08: 10 mg via ORAL
  Administered 2015-01-08: 5 mg via ORAL
  Administered 2015-01-09: 10 mg via ORAL
  Administered 2015-01-09 (×2): 5 mg via ORAL
  Administered 2015-01-09 – 2015-01-10 (×3): 10 mg via ORAL
  Filled 2015-01-08: qty 2
  Filled 2015-01-08: qty 1
  Filled 2015-01-08 (×4): qty 2
  Filled 2015-01-08 (×2): qty 1
  Filled 2015-01-08: qty 2

## 2015-01-08 MED ORDER — LACTATED RINGERS IV SOLN
INTRAVENOUS | Status: DC
  Administered 2015-01-08 (×2): via INTRAVENOUS

## 2015-01-08 MED ORDER — ONDANSETRON HCL 4 MG/2ML IJ SOLN: 4.0000 mg | Freq: Four times a day (QID) | INTRAMUSCULAR | Status: DC | PRN

## 2015-01-08 MED ORDER — CEFAZOLIN SODIUM-DEXTROSE 2-3 GM-% IV SOLR
INTRAVENOUS | Status: AC
  Filled 2015-01-08: qty 50

## 2015-01-08 MED ORDER — MENTHOL 3 MG MT LOZG: 1.0000 | LOZENGE | OROMUCOSAL | Status: DC | PRN

## 2015-01-08 MED ORDER — DIPHENHYDRAMINE HCL 12.5 MG/5ML PO ELIX
12.5000 mg | ORAL_SOLUTION | ORAL | Status: DC | PRN
  Administered 2015-01-09: 25 mg via ORAL
  Filled 2015-01-08: qty 10

## 2015-01-08 MED ORDER — FENTANYL CITRATE (PF) 100 MCG/2ML IJ SOLN
INTRAMUSCULAR | Status: DC | PRN
  Administered 2015-01-08 (×2): 50 ug via INTRAVENOUS

## 2015-01-08 MED ORDER — METOCLOPRAMIDE HCL 5 MG PO TABS: 5.0000 mg | ORAL_TABLET | Freq: Three times a day (TID) | ORAL | Status: DC | PRN

## 2015-01-08 SURGICAL SUPPLY — 44 items
BLADE SAW 1/2 (BLADE) ×3 IMPLANT
BNDG COHESIVE 6X5 TAN STRL LF (GAUZE/BANDAGES/DRESSINGS) ×6 IMPLANT
CANISTER SUCT 1200ML W/VALVE (MISCELLANEOUS) ×3 IMPLANT
CAPT HIP TOTAL 3 ×2 IMPLANT
CATH FOL LEG HOLDER (MISCELLANEOUS) ×3 IMPLANT
CATH TRAY METER 16FR LF (MISCELLANEOUS) ×3 IMPLANT
CHLORAPREP W/TINT 26ML (MISCELLANEOUS) ×3 IMPLANT
DRAPE C-ARM XRAY 36X54 (DRAPES) ×3 IMPLANT
DRAPE INCISE IOBAN 66X60 STRL (DRAPES) IMPLANT
DRAPE POUCH INSTRU U-SHP 10X18 (DRAPES) ×3 IMPLANT
DRAPE SHEET LG 3/4 BI-LAMINATE (DRAPES) ×5 IMPLANT
DRAPE TABLE BACK 80X90 (DRAPES) ×3 IMPLANT
ELECT BLADE 6.5 EXT (BLADE) ×3 IMPLANT
GAUZE SPONGE 4X4 12PLY STRL (GAUZE/BANDAGES/DRESSINGS) ×3 IMPLANT
GLOVE BIOGEL PI IND STRL 9 (GLOVE) ×1 IMPLANT
GLOVE BIOGEL PI INDICATOR 9 (GLOVE) ×4
GLOVE SURG ORTHO 9.0 STRL STRW (GLOVE) ×5 IMPLANT
GOWN SPECIALTY ULTRA XL (MISCELLANEOUS) ×3 IMPLANT
GOWN STRL REUS W/ TWL LRG LVL3 (GOWN DISPOSABLE) ×1 IMPLANT
GOWN STRL REUS W/TWL LRG LVL3 (GOWN DISPOSABLE) ×3
HEMOVAC 400CC 10FR (MISCELLANEOUS) ×3 IMPLANT
HOOD PEEL AWAY FACE SHEILD DIS (HOOD) ×3 IMPLANT
MAT BLUE FLOOR 46X72 FLO (MISCELLANEOUS) ×3 IMPLANT
NDL SAFETY 18GX1.5 (NEEDLE) ×3 IMPLANT
NDL SPNL 18GX3.5 QUINCKE PK (NEEDLE) ×1 IMPLANT
NEEDLE SPNL 18GX3.5 QUINCKE PK (NEEDLE) ×3 IMPLANT
NS IRRIG 1000ML POUR BTL (IV SOLUTION) ×3 IMPLANT
PACK HIP COMPR (MISCELLANEOUS) ×3 IMPLANT
SOL PREP PVP 2OZ (MISCELLANEOUS) ×3
SOLUTION PREP PVP 2OZ (MISCELLANEOUS) ×1 IMPLANT
STAPLER SKIN PROX 35W (STAPLE) ×3 IMPLANT
STRAP SAFETY BODY (MISCELLANEOUS) ×3 IMPLANT
SUT DVC 2 QUILL PDO  T11 36X36 (SUTURE) ×2
SUT DVC 2 QUILL PDO T11 36X36 (SUTURE) ×1 IMPLANT
SUT DVC QUILL MONODERM 30X30 (SUTURE) ×3 IMPLANT
SUT ETHIBOND NAB CT1 #1 30IN (SUTURE) ×3 IMPLANT
SUT SILK 0 (SUTURE) ×3
SUT SILK 0 30XBRD TIE 6 (SUTURE) ×1 IMPLANT
SUT VIC AB 1 CT1 36 (SUTURE) ×3 IMPLANT
SYR 20CC LL (SYRINGE) ×3 IMPLANT
SYR 30ML LL (SYRINGE) ×3 IMPLANT
TAPE MICROFOAM 4IN (TAPE) ×3 IMPLANT
TUBE KAMVAC SUCTION (TUBING) ×3 IMPLANT
WATER STERILE IRR 1000ML POUR (IV SOLUTION) IMPLANT

## 2015-01-08 NOTE — Op Note (Signed)
01/08/2015  9:33 AM  PATIENT:  Logan Mcbride  38 y.o. male  PRE-OPERATIVE DIAGNOSIS:  AVASCULAR NECROSIS LEFT HIP  POST-OPERATIVE DIAGNOSIS:  AVASCULAR NECROSIS LEFT HIP  PROCEDURE:  Procedure(s): TOTAL HIP ARTHROPLASTY ANTERIOR APPROACH (Left)  SURGEON: Leitha Schuller, MD  ASSISTANTS: None  ANESTHESIA:   spinal  EBL:  Total I/O In: 1000 [I.V.:1000] Out: 550 [Urine:300; Blood:250]  BLOOD ADMINISTERED:none  DRAINS: none   LOCAL MEDICATIONS USED:  MARCAINE     SPECIMEN:  Source of Specimen:  Femoral head  DISPOSITION OF SPECIMEN:  PATHOLOGY  COUNTS:  YES  TOURNIQUET:  * No tourniquets in log *  IMPLANTS: Medacta AMIS 5 standard stem, 54 mm Mpact cup DM with liner and S 28 mm head  DICTATION: .Dragon Dictation   The patient was brought to the operating room and after spinal anesthesia was obtained patient was placed on the operative table with the ipsilateral foot into the Medacta attachment, contralateral leg on a well-padded table. C-arm was brought in and preop template x-ray taken. After prepping and draping in usual sterile fashion appropriate patient identification and timeout procedures were completed. Anterior approach to the hip was obtained and centered over the greater trochanter and TFL muscle. The subcutaneous tissue was incised hemostasis being achieved by electrocautery. TFL fascia was incised and the muscle retracted laterally deep retractor placed. The lateral femoral circumflex vessels were identified and ligated. The anterior capsule was exposed and a capsulotomy performed. The neck was identified and a femoral neck cut carried out with a saw. The head was removed without difficulty and showed sclerotic femoral head with extensive degenerative change and deformity and acetabulum had eburnated. Reaming was carried out to 54 mm and a 54 mm cup trial gave appropriate tightness to the acetabular component a 54 mpact DM cup was impacted into position. The leg was then  externally rotated and ischiofemoral and patellofemoral releases carried out. The femur was sequentially broached to a size 5, size 5 stem and S head trials were placed and the final components chosen. The 5 stem was inserted along with a S 28 mm head and 54 mm liner. The hip was reduced and was stable the wound was thoroughly irrigated with a dilute Betadine solution. The deep fascia view. Using a heavy Quill after infiltration of 30 cc of quarter percent Sensorcaine with epinephrine. 2-0 Quill to close the skin with skin staples Xeroform 4 x 4's ABDs and tape patient was sent to recovery in stable condition .  PLAN OF CARE: Admit to inpatient

## 2015-01-08 NOTE — H&P (Signed)
Reviewed paper H+P, will be scanned into chart. No changes noted.  

## 2015-01-08 NOTE — Anesthesia Procedure Notes (Signed)
Spinal Patient location during procedure: OR Staffing Anesthesiologist: Marline Backbone F Resident/CRNA: Rolla Plate Performed by: resident/CRNA  Preanesthetic Checklist Completed: patient identified, site marked, surgical consent, pre-op evaluation, timeout performed, IV checked, risks and benefits discussed and monitors and equipment checked Spinal Block Patient position: sitting Prep: Betadine and site prepped and draped Patient monitoring: heart rate, continuous pulse ox, blood pressure and cardiac monitor Approach: midline Location: L4-5 Injection technique: single-shot Needle Needle type: Whitacre and Introducer  Needle gauge: 25 G Needle length: 9 cm Additional Notes Negative paresthesia. Negative blood return. Positive free-flowing CSF. Expiration date of kit checked and confirmed. Patient tolerated procedure well, without complications.

## 2015-01-08 NOTE — Anesthesia Postprocedure Evaluation (Signed)
  Anesthesia Post-op Note  Patient: Logan Mcbride  Procedure(s) Performed: Procedure(s): TOTAL HIP ARTHROPLASTY ANTERIOR APPROACH (Left)  Anesthesia type:Spinal  Patient location: PACU  Post pain: Pain level controlled  Post assessment: Post-op Vital signs reviewed, Patient's Cardiovascular Status Stable, Respiratory Function Stable, Patent Airway and No signs of Nausea or vomiting  Post vital signs: Reviewed and stable  Last Vitals:  Filed Vitals:   01/08/15 0932  BP: 126/82  Pulse: 88  Temp: 36 C  Resp: 16    Level of consciousness: awake, alert  and patient cooperative  Complications: No apparent anesthesia complications

## 2015-01-08 NOTE — Transfer of Care (Signed)
Immediate Anesthesia Transfer of Care Note  Patient: Logan Mcbride  Procedure(s) Performed: Procedure(s): TOTAL HIP ARTHROPLASTY ANTERIOR APPROACH (Left)  Patient Location: PACU  Anesthesia Type:Spinal  Level of Consciousness: awake  Airway & Oxygen Therapy: Patient Spontanous Breathing and Patient connected to face mask oxygen  Post-op Assessment: Report given to RN  Post vital signs: Reviewed  Last Vitals:  Filed Vitals:   01/08/15 0932  BP: 126/82  Pulse: 88  Temp: 36 C  Resp: 16    Complications: No apparent anesthesia complications

## 2015-01-08 NOTE — Evaluation (Signed)
Physical Therapy Evaluation Patient Details Name: Logan Mcbride MRN: [id number] DOB: Dec 16, 1975 Today's Date: 01/08/2015   History of Present Illness  Pt is a 39 yo male who was admitted to the hospital s/p L THA on 01/08/15  Clinical Impression  Pt presents with hx of arthritis, tobacco use, hip pain and HTN. Examination reveals that pt performs bed mobility at supervision, transfers at [facility], and ambulation of 15 ft at [facility]. Given pt's age and good overall strength, he can be a bit impulsive with mobility. He is otherwise pleasant to work with and motivated to participate in therapy. Pt did report fairly high pain (9/10). PT will continue to coordinate treatment times with administered pain meds. Due to his acute pain, ROM, and gait deficits, pt will continue to benefit from skilled PT in order for him to return home safely.     Follow Up Recommendations Home health PT;Supervision for mobility/OOB    Equipment Recommendations  Rolling walker with 5" wheels    Recommendations for Other Services       Precautions / Restrictions Precautions Precautions: Anterior Hip;Fall Precaution Booklet Issued: No      Mobility  Bed Mobility Overal bed mobility: Needs Assistance Bed Mobility: Supine to Sit     Supine to sit: Supervision     General bed mobility comments: Pt only requires supervision to ensure safety of LLE with mobility. Pt demonstrates good use of side rails and ability to control his trunk and LEs to get to EOB  Transfers Overall transfer level: Needs assistance Equipment used: Rolling walker (2 wheeled) Transfers: Sit to/from Stand Sit to Stand: Min guard         General transfer comment: Pt impulsive and needs to be slowed down (wait for therapist to get gait belt; wait to get RW in front). Generally good functional strength with standing and sitting. Pt requires cues for use of hands. Good stability in standing. No LOB.   Ambulation/Gait Ambulation/Gait assistance:  Min guard Ambulation Distance (Feet): 15 Feet Assistive device: Rolling walker (2 wheeled) Gait Pattern/deviations: Step-to pattern;Shuffle;Decreased step length - right;Decreased step length - left Gait velocity: decreased Gait velocity interpretation: Below normal speed for age/gender General Gait Details: Pt needs cues for sequencing of RW with gait. Pt wished to ambulate further than the chair, so he performed ambulation F/B in front of the chair.   Stairs            Wheelchair Mobility    Modified Rankin (Stroke Patients Only)       Balance Overall balance assessment: No apparent balance deficits (not formally assessed)                                           Pertinent Vitals/Pain Pain Assessment: 0-10 Pain Score: 9  Pain Location: L hip  Pain Intervention(s): Limited activity within patient's tolerance;Monitored during session;Premedicated before session    Home Living Family/patient expects to be discharged to:: Private residence Living Arrangements: Spouse/significant other;Children Available Help at Discharge: Family;Available 24 hours/day Type of Home: House Home Access: Stairs to enter Entrance Stairs-Rails: None Entrance Stairs-Number of Steps: 3     Additional Comments: Has no DME at home    Prior Function Level of Independence: Independent         Comments: Pt was ambulating, working, driving, and performing ADLs indep prior to surgery  Hand Dominance        Extremity/Trunk Assessment   Upper Extremity Assessment: Overall WFL for tasks assessed           Lower Extremity Assessment: LLE deficits/detail   LLE Deficits / Details: Pt with MMT of at least 2+/5 in gross LLE     Communication   Communication: No difficulties  Cognition Arousal/Alertness: Awake/alert Behavior During Therapy: WFL for tasks assessed/performed Overall Cognitive Status: Within Functional Limits for tasks assessed                       General Comments      Exercises Other Exercises Other Exercises: Pt performed bilateral LE therex x 10 reps at min assist for facilitation of movement. Exercises included: ankle pumps, SAQ, SLR (mod assist), and hip abd.      Assessment/Plan    PT Assessment Patient needs continued PT services  PT Diagnosis Difficulty walking;Abnormality of gait;Acute pain   PT Problem List Decreased strength;Decreased range of motion;Decreased activity tolerance;Decreased mobility;Decreased knowledge of use of DME;Decreased safety awareness;Decreased knowledge of precautions;Pain  PT Treatment Interventions DME instruction;Gait training;Stair training;Functional mobility training;Therapeutic activities;Therapeutic exercise;Balance training;Neuromuscular re-education;Cognitive remediation;Patient/family education;Manual techniques;Wheelchair mobility training   PT Goals (Current goals can be found in the Care Plan section) Acute Rehab PT Goals Patient Stated Goal: to return home PT Goal Formulation: With patient Time For Goal Achievement: 01/22/15 Potential to Achieve Goals: Good    Frequency BID   Barriers to discharge        Co-evaluation               End of Session Equipment Utilized During Treatment: Gait belt Activity Tolerance: Patient tolerated treatment well;No increased pain Patient left: in chair;with call bell/phone within reach;with chair alarm set;with SCD's reapplied Nurse Communication: Mobility status         Time: 1405-1430 PT Time Calculation (min) (ACUTE ONLY): 25 min   Charges:         PT G CodesBenna Dunks 2015-01-16, 4:46 PM  Benna Dunks, SPT. [phone]

## 2015-01-08 NOTE — Anesthesia Preprocedure Evaluation (Signed)
Anesthesia Evaluation  Patient identified by MRN, date of birth, ID band Patient awake    Reviewed: Allergy & Precautions, NPO status , Patient's Chart, lab work & pertinent test results  Airway Mallampati: II       Dental no notable dental hx.    Pulmonary Current Smoker,    Pulmonary exam normal        Cardiovascular hypertension, Normal cardiovascular exam     Neuro/Psych    GI/Hepatic negative GI ROS, Neg liver ROS,   Endo/Other  negative endocrine ROS  Renal/GU negative Renal ROS     Musculoskeletal negative musculoskeletal ROS (+)   Abdominal Normal abdominal exam  (+)   Peds  Hematology negative hematology ROS (+)   Anesthesia Other Findings   Reproductive/Obstetrics                             Anesthesia Physical Anesthesia Plan  ASA: III  Anesthesia Plan: Spinal   Post-op Pain Management:    Induction:   Airway Management Planned: Nasal Cannula  Additional Equipment:   Intra-op Plan:   Post-operative Plan:   Informed Consent: I have reviewed the patients History and Physical, chart, labs and discussed the procedure including the risks, benefits and alternatives for the proposed anesthesia with the patient or authorized representative who has indicated his/her understanding and acceptance.     Plan Discussed with: CRNA  Anesthesia Plan Comments:         Anesthesia Quick Evaluation

## 2015-01-08 NOTE — Progress Notes (Signed)
Dr. Rosita Kea notifited of pain level 9/10 after ordered pain medications given with no relief. Also, patient is smoker. Dr. Rosita Kea ordered  iv dilaudid x1. If no relief in 5 mins, give another 1 mg iv.

## 2015-01-08 NOTE — Plan of Care (Signed)
Problem: Consults Goal: Diagnosis- Total Joint Replacement Outcome: Progressing Primary Total Hip     

## 2015-01-09 LAB — BASIC METABOLIC PANEL
Anion gap: 7 (ref 5–15)
BUN: 7 mg/dL (ref 6–20)
CALCIUM: 8.4 mg/dL — AB (ref 8.9–10.3)
CO2: 28 mmol/L (ref 22–32)
CREATININE: 0.88 mg/dL (ref 0.61–1.24)
Chloride: 104 mmol/L (ref 101–111)
Glucose, Bld: 101 mg/dL — ABNORMAL HIGH (ref 65–99)
Potassium: 3.6 mmol/L (ref 3.5–5.1)
SODIUM: 139 mmol/L (ref 135–145)

## 2015-01-09 LAB — CBC
HCT: 39.7 % — ABNORMAL LOW (ref 40.0–52.0)
Hemoglobin: 13.7 g/dL (ref 13.0–18.0)
MCH: 31.9 pg (ref 26.0–34.0)
MCHC: 34.6 g/dL (ref 32.0–36.0)
MCV: 92.2 fL (ref 80.0–100.0)
PLATELETS: 146 10*3/uL — AB (ref 150–440)
RBC: 4.3 MIL/uL — ABNORMAL LOW (ref 4.40–5.90)
RDW: 13.5 % (ref 11.5–14.5)
WBC: 13.4 10*3/uL — ABNORMAL HIGH (ref 3.8–10.6)

## 2015-01-09 NOTE — Progress Notes (Signed)
Physical Therapy Treatment Patient Details Name: Logan Mcbride MRN: [id number] DOB: 06-20-1975 Today's Date: 01/09/2015    History of Present Illness This patient is a 39 year old male who came to [facility] for a L THR (anterior) secondary to avascular necrosis.    PT Comments    Pt making good progress with ambulation now that the foot drop on the L has been assisted with ace wrap. Pt able to ambulate considerably further, but still gets fatigued. He continues to improve independence with bed mobility and transfers, but still needs cues to slow down and wait for therapist. Due to his pain, ROM, and gait deficits, he will continue to benefit from skilled PT in order for him to return home safely.   Follow Up Recommendations  Home health PT;Supervision for mobility/OOB     Equipment Recommendations  Rolling walker with 5" wheels    Recommendations for Other Services       Precautions / Restrictions Precautions Precautions: Anterior Hip;Fall Restrictions Weight Bearing Restrictions: No LLE Weight Bearing: Weight bearing as tolerated    Mobility  Bed Mobility Overal bed mobility: Needs Assistance Bed Mobility: Supine to Sit     Supine to sit: Modified independent (Device/Increase time)     General bed mobility comments: Pt able to safely perform bed mobility with good control of his LLE and use of hands on side rails   Transfers Overall transfer level: Needs assistance Equipment used: Rolling walker (2 wheeled) Transfers: Sit to/from Stand Sit to Stand: Supervision         General transfer comment: Pt able to stand with good functional strength. Still needs some cueing not to stand up without therapist being ready. No LOB or unsteadiness in standing with RW.  (Cue pt to slow down - faster movements tend to increase pain)  Ambulation/Gait Ambulation/Gait assistance: Min guard Ambulation Distance (Feet): 150 Feet Assistive device: Rolling walker (2 wheeled) Gait  Pattern/deviations: Step-through pattern (foot drop bilat, internal rotation on left) Gait velocity: decreased Gait velocity interpretation: Below normal speed for age/gender General Gait Details: Pt was wrapped in ace wrap dorsiflexion assist this afternoon to assist with foot drop. Pt was able to ambulate better with this with better swing through. Pt needs cues to shorten step with R foot in order to advance the L foot symmetrically and reciprocally     Stairs            Wheelchair Mobility    Modified Rankin (Stroke Patients Only)       Balance Overall balance assessment: No apparent balance deficits (not formally assessed)                                  Cognition Arousal/Alertness: Awake/alert Behavior During Therapy: WFL for tasks assessed/performed Overall Cognitive Status: Within Functional Limits for tasks assessed                      Exercises Other Exercises Other Exercises: Pt performed bilateral LE therex x 12 reps at supervision for proper technique. Exercises included: ankle pumps, LAQ, quad sets, glute sets, SLR (min assist), and hip abd.    General Comments        Pertinent Vitals/Pain Pain Assessment: 0-10 Pain Score: 5  Pain Location: L hip  Pain Intervention(s): Limited activity within patient's tolerance;Monitored during session;Premedicated before session;Ice applied    Home Living  Prior Function            PT Goals (current goals can now be found in the care plan section) Acute Rehab PT Goals Patient Stated Goal: to walk further this afternoon PT Goal Formulation: With patient Time For Goal Achievement: 01/22/15 Potential to Achieve Goals: Good Progress towards PT goals: Progressing toward goals    Frequency  BID    PT Plan Current plan remains appropriate    Co-evaluation             End of Session Equipment Utilized During Treatment: Gait belt Activity Tolerance:  Patient tolerated treatment well Patient left: in bed;with bed alarm set;with SCD's reapplied;with family/visitor present;with call bell/phone within reach     Time: 1415-1439 PT Time Calculation (min) (ACUTE ONLY): 24 min  Charges:                       G CodesBenna Dunks 24-Jan-2015, 5:26 PM  Benna Dunks, SPT. [phone]

## 2015-01-09 NOTE — Evaluation (Signed)
Occupational Therapy Evaluation Patient Details Name: Logan Mcbride MRN: [id number] DOB: [birth year] Today's Date: 01/09/2015    History of Present Illness This patient is a 39 year old male who came to [facility] for a L THR (anterior) secondary to avascular necrosis.   Clinical Impression   This patient is a 39 year old male who came to [facility] for a left total hip replacement (anterior approach) .  Patient lives with wife and had been independent with ADL and functional mobility.  He was able to sit at edge of bed and demonstrated ability to dress lower body. He did use hip kit but will not need it soon. Be available if needed.       Follow Up Recommendations       Equipment Recommendations       Recommendations for Other Services       Precautions / Restrictions Precautions Precautions: Anterior Hip;Fall Restrictions Weight Bearing Restrictions: Yes LLE Weight Bearing: Weight bearing as tolerated      Mobility Bed Mobility Overal bed mobility:  (supine to sit and sit to supine independent)       Supine to sit: Supervision     General bed mobility comments: verbal cues for safety  Transfers                      Balance                                            ADL                                         General ADL Comments: Had been independent with ADL and mobility. Patient Donned/doffed socks and pants to knees using hip kit with verbal cues for technique.      Vision     Perception     Praxis      Pertinent Vitals/Pain Pain Score: 10-Worst pain ever Pain Location: L hip     Hand Dominance     Extremity/Trunk Assessment Upper Extremity Assessment Upper Extremity Assessment: Overall WFL for tasks assessed   Lower Extremity Assessment Lower Extremity Assessment: Defer to PT evaluation       Communication Communication Communication: No difficulties   Cognition  Arousal/Alertness: Awake/alert Behavior During Therapy: WFL for tasks assessed/performed did cooperate but seemed somewhat disinterested. Overall Cognitive Status: Within Functional Limits for tasks assessed                     General Comments       Exercises       Shoulder Instructions      Home Living Family/patient expects to be discharged to:: Private residence Living Arrangements: Spouse/significant other;Children Available Help at Discharge: Family;Available 24 hours/day Type of Home: House Home Access: Stairs to enter   Entrance Stairs-Rails: None                     Additional Comments: Has no DME at home      Prior Functioning/Environment Level of Independence: Independent             OT Diagnosis: Acute pain   OT Problem List:     OT Treatment/Interventions:  OT Goals(Current goals can be found in the care plan section) Acute Rehab OT Goals Patient Stated Goal: to return home OT Goal Formulation: With patient Time For Goal Achievement: 01/23/15 Potential to Achieve Goals: Good  OT Frequency:     Barriers to D/C:            Co-evaluation              End of Session Equipment Utilized During Treatment:  (hip kit)  Activity Tolerance:   Patient left: in bed;with call bell/phone within reach;with bed alarm set   Time: 0383-3383 OT Time Calculation (min): 10 min Charges:  OT General Charges $OT Visit: 1 Procedure OT Evaluation $Initial OT Evaluation Tier I: 1 Procedure G-Codes:    Myrene Galas, MS/OTR/L  01/09/2015, 9:53 AM

## 2015-01-09 NOTE — Progress Notes (Addendum)
Spoke to Dr. Rosita Kea regarding patients BP.  Seems to be trending upward, last BP 121/104. Patient has been in pain & he states that he "is stressed".Rosita Kea said he would be by to see him after office hours.

## 2015-01-09 NOTE — Care Management Note (Addendum)
Case Management Note  Patient Details  Name: Logan Mcbride MRN: [id number] Date of Birth: 05-Sep-1975  Subjective/Objective:                  Met with patient to discuss discharge planning. He would like to use [facility] [place] for home health. He states his wife can inject Lovenox. He states he will need a rolling walker. He uses [facility] for Rx  [phone].   Action/Plan: List of home health care agencies shared with patient and his wife. Referral called to [facility] with [place]. Rolling walker requested from [place] with [facility]. Lovenox (brand name only) $RemoveBe'40mg'[id number]$  #14 called in to [facility] and usually costs ~$4 if they have brand name in [place]- that is all Medicaid will cover.  RNCM will continue to follow.   Expected Discharge Date:                  Expected Discharge Plan:     In-House Referral:     Discharge planning Services  CM Consult  Post Acute Care Choice:  Durable Medical Equipment, Home Health Choice offered to:  Patient, Spouse  DME Arranged:  Walker rolling DME Agency:  [place]:  PT [facility] Agency:  [place]  Status of Service:  In process, will continue to follow  Medicare Important Message Given:    Date Medicare IM Given:    Medicare IM give by:    Date Additional Medicare IM Given:    Additional Medicare Important Message give by:     If discussed at [place] of Stay Meetings, dates discussed:    Additional Comments: Rolling walker delivered by [place]. Lovenox $ 3.  Marshell Garfinkel, RN 01/09/2015, 10:53 AM

## 2015-01-09 NOTE — Progress Notes (Signed)
   Subjective: 1 Day Post-Op Procedure(s) (LRB): TOTAL HIP ARTHROPLASTY ANTERIOR APPROACH (Left) Patient reports pain as mild.   Patient is well, and has had no acute complaints or problems. Pain improved this am.  We will continue therapy today.  Plan is to go Home after hospital stay.  Objective: Vital signs in last 24 hours: Temp:  [96.8 F (36 C)-99 F (37.2 C)] 99 F (37.2 C) (09/20 1935) Pulse Rate:  [26-97] 97 (09/21 0409) Resp:  [16-23] 18 (09/21 0409) BP: (114-164)/(64-99) 155/98 mmHg (09/21 0409) SpO2:  [99 %-100 %] 100 % (09/21 0409) FiO2 (%):  [21 %] 21 % (09/20 1111)  Intake/Output from previous day: 09/20 0701 - 09/21 0700 In: 3860.3 [P.O.:240; I.V.:3620.3] Out: 4335 [Urine:4085; Blood:250] Intake/Output this shift: Total I/O In: 1306.3 [I.V.:1306.3] Out: 3300 [Urine:3300]   Recent Labs  01/08/15 0635 01/08/15 1213 01/09/15 0531  HGB 14.6 13.1 13.7    Recent Labs  01/08/15 1213 01/09/15 0531  WBC 18.3* 13.4*  RBC 4.16* 4.30*  HCT 38.3* 39.7*  PLT 147* 146*    Recent Labs  01/08/15 0635 01/08/15 1213  NA 140  --   K 3.8  --   CREATININE  --  0.88  GLUCOSE 90  --    No results for input(s): LABPT, INR in the last 72 hours.  EXAM General - Patient is Alert, Appropriate and Oriented Extremity - Neurovascular intact Sensation intact distally Intact pulses distally Dressing - dressing C/D/I and no drainage Motor Function - intact, moving foot and toes well on exam.   Past Medical History  Diagnosis Date  . Arthritis   . Smoker   . Hip pain   . Hypertension     borderline on no meds    Assessment/Plan:   1 Day Post-Op Procedure(s) (LRB): TOTAL HIP ARTHROPLASTY ANTERIOR APPROACH (Left) Active Problems:   Avascular necrosis of bone of hip  Estimated body mass index is 27.53 kg/(m^2) as calculated from the following:   Height as of this encounter: 6' (1.829 m).   Weight as of this encounter: 92.08 kg (203 lb). Advance diet Up  with therapy  Needs BM Recheck labs in the am  DVT Prophylaxis - Lovenox, Foot Pumps and TED hose Weight-Bearing as tolerated to left leg D/C O2 and Pulse OX and try on Room Air  T. Cranston Neighbor, PA-C [facility] 01/09/2015, 6:56 AM

## 2015-01-09 NOTE — Progress Notes (Signed)
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    PT Comments    Pt making good progress towards goals this AM. He is motivated to participate in therapy and is eager to progress. Pt has chronic foot drop on bilateral LEs with the left in particular being of greater significance. This is limiting the quality of his ambulation coupled with his current L hip impairments. May try dorsiflexion assist this afternoon to augment gait performance. Pt will continue to benefit from skilled PT in order to return home safely.   Follow Up Recommendations  Home health PT;Supervision for mobility/OOB     Equipment Recommendations  Rolling walker with 5" wheels    Recommendations for Other Services       Precautions / Restrictions Precautions Precautions: Anterior Hip;Fall Restrictions Weight Bearing Restrictions: No LLE Weight Bearing: Weight bearing as tolerated    Mobility  Bed Mobility Overal bed mobility: Needs Assistance Bed Mobility: Supine to Sit     Supine to sit: Modified independent (Device/Increase time)     General bed mobility comments: Pt able to safely perform bed mobility with good control of his LLE and use of hands on side rails   Transfers Overall transfer level: Needs assistance Equipment used: Rolling walker (2 wheeled) Transfers: Sit to/from Stand Sit to Stand: Supervision         General transfer comment: Pt able to stand with good functional strength. Still needs some cueing not to stand up without therapist being ready. No LOB or unsteadiness in standing with RW.   Ambulation/Gait Ambulation/Gait assistance: Min guard Ambulation Distance (Feet): 50 Feet Assistive device: Rolling walker (2 wheeled) Gait Pattern/deviations: Step-through  pattern;Decreased step length - right;Decreased step length - left;Decreased dorsiflexion - right;Decreased dorsiflexion - left;Narrow base of support (Greater foot drop on L foot) Gait velocity: decreased Gait velocity interpretation: Below normal speed for age/gender General Gait Details: Pt needs cues to push through RW to off load L foot in order to clear the foot in swing through. Pt states that he has had multiple AFOs before, but they keep breaking so he chooses not to use one. Gait impairments and fatigue limited ambulation distance this morning, but pt motivated to go further this afternoon   Stairs            Wheelchair Mobility    Modified Rankin (Stroke Patients Only)       Balance Overall balance assessment: No apparent balance deficits (not formally assessed)                                  Cognition Arousal/Alertness: Awake/alert Behavior During Therapy: WFL for tasks assessed/performed Overall Cognitive Status: Within Functional Limits for tasks assessed                      Exercises Other Exercises Other Exercises: Pt performed bilateral LE therex x 12 reps at supervision for proper technique. Exercises included: ankle pumps, LAQ, quad sets, glute sets, SLR (min assist), and hip abd.    General Comments        Pertinent Vitals/Pain Pain Assessment: 0-10 Pain Score: 6  Pain Location: L hip  Pain Descriptors / Indicators: Burning Pain Intervention(s): Limited activity  within patient's tolerance;Ice applied;Monitored during session;Premedicated before session    Home Living Family/patient expects to be discharged to:: Private residence Living Arrangements: Spouse/significant other;Children Available Help at Discharge: Family;Available 24 hours/day Type of Home: House Home Access: Stairs to enter Entrance Stairs-Rails: None     Additional Comments: Has no DME at home    Prior Function Level of Independence: Independent           PT Goals (current goals can now be found in the care plan section) Acute Rehab PT Goals Patient Stated Goal: to walk  PT Goal Formulation: With patient Time For Goal Achievement: 01/22/15 Potential to Achieve Goals: Good Progress towards PT goals: Progressing toward goals    Frequency  BID    PT Plan Current plan remains appropriate    Co-evaluation             End of Session Equipment Utilized During Treatment: Gait belt Activity Tolerance: Patient tolerated treatment well (slightly limited by foot drop) Patient left: in chair;with call bell/phone within reach;with chair alarm set;with SCD's reapplied     Time: 9604-5409 PT Time Calculation (min) (ACUTE ONLY): 24 min  Charges:                       G CodesBenna Dunks Jan 17, 2015, 10:36 AM  Benna Dunks, SPT. [phone]

## 2015-01-10 LAB — SURGICAL PATHOLOGY

## 2015-01-10 MED ORDER — OXYCODONE HCL 5 MG PO TABS: 5.0000 mg | ORAL_TABLET | ORAL | Status: AC | PRN

## 2015-01-10 MED ORDER — ENOXAPARIN SODIUM 40 MG/0.4ML ~~LOC~~ SOLN: 40.0000 mg | SUBCUTANEOUS | Status: AC

## 2015-01-10 NOTE — Discharge Instructions (Signed)

## 2015-01-10 NOTE — Progress Notes (Signed)
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    PT Comments    Pt much improved with mobility this morning. PT still used dorsiflexion assist ace wrap to assist ambulation. PT also recommended AFO for permanent use at discharge for safety with ambulation. Pt still has pain, ROM, and gait deficits that will continue to benefit from skilled PT in order for him to return home safely. Pt is motivated for the next stage of therapy.   Follow Up Recommendations  Home health PT;Supervision for mobility/OOB (AFO for L foot )     Equipment Recommendations  Rolling walker with 5" wheels    Recommendations for Other Services       Precautions / Restrictions Precautions Precautions: Anterior Hip;Fall Precaution Booklet Issued: No Restrictions Weight Bearing Restrictions: No LLE Weight Bearing: Weight bearing as tolerated    Mobility  Bed Mobility Overal bed mobility: Needs Assistance Bed Mobility: Supine to Sit     Supine to sit: Independent     General bed mobility comments: Total independence with bed mobility  Transfers Overall transfer level: Modified independent Equipment used: Rolling walker (2 wheeled) Transfers: Sit to/from Stand Sit to Stand: Modified independent (Device/Increase time)         General transfer comment: Pt transfers with good functional strength and   Ambulation/Gait Ambulation/Gait assistance: Supervision Ambulation Distance (Feet): 250 Feet Assistive device: Rolling walker (2 wheeled) Gait Pattern/deviations: Step-through pattern;Antalgic Gait velocity: decreased (improved greatly ) Gait velocity interpretation: Below normal speed for age/gender General Gait Details: Pt again wrapped in ace dorsiflexion assist. Pt performing gait with  greater speed and smoothness. Pt consistently performing reciprocal gait pattern    Stairs Stairs: Yes Stairs assistance: Min assist Stair Management: No rails (hand held assist) Number of Stairs: 4 General stair comments: Pt requires assist for balance but otherwise shows good strength with getting up and down stairs. Pt encouraged to use two older sons to assist with stairs initially   Wheelchair Mobility    Modified Rankin (Stroke Patients Only)       Balance Overall balance assessment: No apparent balance deficits (not formally assessed)                                  Cognition Arousal/Alertness: Awake/alert Behavior During Therapy: WFL for tasks assessed/performed Overall Cognitive Status: Within Functional Limits for tasks assessed                      Exercises Other Exercises Other Exercises: Pt performed bilateral LE therex x 15 reps at supervision for proper technique. Exercises included: ankle pumps, LAQ, quad sets, glute sets, SLR (min assist), and standing hip abd.    General Comments        Pertinent Vitals/Pain Pain Assessment: 0-10 Pain Score: 3  Pain Location: L hip Pain Descriptors / Indicators: Burning Pain Intervention(s): Limited activity within patient's tolerance;Monitored during session;Premedicated before session;Ice applied    Home Living                      Prior Function            PT Goals (current goals can now be found in the care plan  section) Acute Rehab PT Goals Patient Stated Goal: to walk further this afternoon PT Goal Formulation: With patient Time For Goal Achievement: 01/22/15 Potential to Achieve Goals: Good Progress towards PT goals: Progressing toward goals    Frequency  BID    PT Plan Current plan remains appropriate    Co-evaluation             End of Session Equipment Utilized During Treatment: Gait belt Activity Tolerance: Patient tolerated treatment well Patient  left: in bed;with bed alarm set;with SCD's reapplied;with family/visitor present;with call bell/phone within reach     Time: 0829-0856 PT Time Calculation (min) (ACUTE ONLY): 27 min  Charges:                       G CodesBenna Dunks 2015/01/18, 12:10 PM  Benna Dunks, SPT. [phone]

## 2015-01-10 NOTE — Progress Notes (Signed)
   Subjective: 2 Days Post-Op Procedure(s) (LRB): TOTAL HIP ARTHROPLASTY ANTERIOR APPROACH (Left) Patient reports pain as mild.   Patient is well, and has had no acute complaints or problems We will start therapy today.  Plan is to go Home after hospital stay.  Objective: Vital signs in last 24 hours: Temp:  [98.9 F (37.2 C)-99.9 F (37.7 C)] 99.9 F (37.7 C) (09/22 0709) Pulse Rate:  [104-113] 113 (09/22 0709) Resp:  [18-19] 18 (09/22 0513) BP: (121-142)/(76-104) 142/85 mmHg (09/22 0709) SpO2:  [94 %-100 %] 97 % (09/22 0709)  Intake/Output from previous day: 09/21 0701 - 09/22 0700 In: 2892.9 [P.O.:720; I.V.:2172.9] Out: 2475 [Urine:2475] Intake/Output this shift:     Recent Labs  01/08/15 0635 01/08/15 1213 01/09/15 0531  HGB 14.6 13.1 13.7    Recent Labs  01/08/15 1213 01/09/15 0531  WBC 18.3* 13.4*  RBC 4.16* 4.30*  HCT 38.3* 39.7*  PLT 147* 146*    Recent Labs  01/08/15 0635 01/08/15 1213 01/09/15 0531  NA 140  --  139  K 3.8  --  3.6  CL  --   --  104  CO2  --   --  28  BUN  --   --  7  CREATININE  --  0.88 0.88  GLUCOSE 90  --  101*  CALCIUM  --   --  8.4*   No results for input(s): LABPT, INR in the last 72 hours.  EXAM General - Patient is Alert, Appropriate and Oriented Extremity - Neurovascular intact Sensation intact distally Intact pulses distally No cellulitis present Dressing - dressing C/D/I and no drainage Motor Function - intact, moving foot and toes well on exam.   Past Medical History  Diagnosis Date  . Arthritis   . Smoker   . Hip pain   . Hypertension     borderline on no meds    Assessment/Plan:   2 Days Post-Op Procedure(s) (LRB): TOTAL HIP ARTHROPLASTY ANTERIOR APPROACH (Left) Active Problems:   Avascular necrosis of bone of hip  Estimated body mass index is 27.53 kg/(m^2) as calculated from the following:   Height as of this encounter: 6' (1.829 m).   Weight as of this encounter: 92.08 kg (203  lb). Advance diet Up with therapy  Discharge home with HHPT Follow up with [facility] in 2 weeks  DVT Prophylaxis - Lovenox, Foot Pumps and TED hose Weight-Bearing as tolerated to left leg D/C O2 and Pulse OX and try on Room Air  T. Cranston Neighbor, PA-C [facility] 01/10/2015, 8:30 AM

## 2015-01-10 NOTE — Progress Notes (Signed)
afo placed pt discharged to home with home health

## 2015-01-10 NOTE — Care Management (Signed)
Patient discharging home today. I have notified Jason with [facility] of patient discharge. No further RNCM needs. Case closed.  

## 2015-01-10 NOTE — Discharge Summary (Signed)
Physician Discharge Summary  Patient ID: Logan Mcbride MRN: [id number] DOB/AGE: 07-06-1975 39 y.o.  Admit date: 01/08/2015 Discharge date: 01/10/2015  Admission Diagnoses:  AVASCULAR NECROSIS LEFT HIP   Discharge Diagnoses: Patient Active Problem List   Diagnosis Date Noted  . Avascular necrosis of bone of hip 01/08/2015    Past Medical History  Diagnosis Date  . Arthritis   . Smoker   . Hip pain   . Hypertension     borderline on no meds      Discharged Condition: Improved  Hospital Course: Logan Mcbride is an 39 y.o. male who was admitted 01/08/2015 with a diagnosis of left hip AVN and went to the operating room on 01/08/2015 and underwent the above named procedures.    Surgeries: Procedure(s): TOTAL HIP ARTHROPLASTY ANTERIOR APPROACH on 01/08/2015 Patient tolerated the surgery well. Taken to PACU where she was stabilized and then transferred to the orthopedic floor.  Started on Lovenox 30 q 12 hrs. Foot pumps applied bilaterally at 80 mm. Heels elevated on bed with rolled towels. No evidence of DVT. Negative Homan. Physical therapy started on day #1 for gait training and transfer. OT started day #1 for ADL and assisted devices.  Patient's foley was d/c on day #1. Patient's IV and hemovac was d/c on day #2.  On post op day #3 patient was stable and ready for discharge to home with HHPT.  Implants: Medacta AMIS 5 standard stem, 54 mm Mpact cup DM with liner and S 28 mm head  He was given perioperative antibiotics:  Anti-infectives    Start     Dose/Rate Route Frequency Ordered Stop   01/08/15 1115  ceFAZolin (ANCEF) IVPB 2 g/50 mL premix     2 g 100 mL/hr over 30 Minutes Intravenous Every 6 hours 01/08/15 1110 01/09/15 0112   01/08/15 0615  ceFAZolin (ANCEF) IVPB 2 g/50 mL premix     2 g 100 mL/hr over 30 Minutes Intravenous  Once 01/08/15 0608 01/08/15 0746   01/08/15 0552  ceFAZolin (ANCEF) 2-3 GM-% IVPB SOLR    Comments:  KENNEDY, ASHLEY: cabinet override   01/08/15 0552 01/08/15 1759    .  He was given sequential compression devices, early ambulation, and lovenox for DVT prophylaxis.  He benefited maximally from the hospital stay and there were no complications.    Recent vital signs:  Filed Vitals:   01/10/15 0709  BP: 142/85  Pulse: 113  Temp: 99.9 F (37.7 C)  Resp:     Recent laboratory studies:  Lab Results  Component Value Date   HGB 13.7 01/09/2015   HGB 13.1 01/08/2015   HGB 14.6 01/08/2015   Lab Results  Component Value Date   WBC 13.4* 01/09/2015   PLT 146* 01/09/2015   Lab Results  Component Value Date   INR 0.90 12/26/2014   Lab Results  Component Value Date   NA 139 01/09/2015   K 3.6 01/09/2015   CL 104 01/09/2015   CO2 28 01/09/2015   BUN 7 01/09/2015   CREATININE 0.88 01/09/2015   GLUCOSE 101* 01/09/2015    Discharge Medications:     Medication List    TAKE these medications        cyclobenzaprine 10 MG tablet  Commonly known as:  FLEXERIL  Take 1/2 by mouth every 8 hours during the day and 1 in pm as needed for back pain     diclofenac 75 MG EC tablet  Commonly known as:  VOLTAREN  Take 1 tablet (75 mg total) by mouth 2 (two) times daily.     enoxaparin 40 MG/0.4ML injection  Commonly known as:  LOVENOX  Inject 0.4 mLs (40 mg total) into the skin daily.     meloxicam 15 MG tablet  Commonly known as:  MOBIC  Take 15 mg by mouth daily.     oxyCODONE 5 MG immediate release tablet  Commonly known as:  Oxy IR/ROXICODONE  Take 1-2 tablets (5-10 mg total) by mouth every 3 (three) hours as needed for breakthrough pain.     traMADol 50 MG tablet  Commonly known as:  ULTRAM  Take 50 mg by mouth every 6 (six) hours as needed for moderate pain.        Diagnostic Studies: Dg Hip Operative Unilat With Pelvis Left  01/08/2015   CLINICAL DATA:  Degenerative joint disease of the left hip.  EXAM: OPERATIVE left HIP (WITH PELVIS IF PERFORMED) 4 VIEWS  TECHNIQUE: Fluoroscopic spot image(s)  were submitted for interpretation post-operatively.  FLUOROSCOPY TIME:  37 seconds.  COMPARISON:  None.  FINDINGS: These images demonstrate placement of left hip arthroplasty for treatment of avascular necrosis and severe degenerative joint disease. The prosthesis appears to be well situated. Right hip appears grossly normal.  IMPRESSION: Status post left hip arthroplasty.   Electronically Signed   By: Lupita Raider, M.D.   On: 01/08/2015 09:48   Dg Hip Unilat W Or W/o Pelvis 2-3 Views Left  01/08/2015   CLINICAL DATA:  Postop hip replacement.  EXAM: DG HIP (WITH OR WITHOUT PELVIS) 2-3V LEFT  COMPARISON:  None.  FINDINGS: Changes of left hip replacement. Normal alignment. No complicating feature. Extensive plate and screw fixation devices within the pelvis.  IMPRESSION: Left hip replacement.  No visible complicating feature.   Electronically Signed   By: Charlett Nose M.D.   On: 01/08/2015 10:56    Disposition: 01-Home or Self Care        Follow-up Information    Follow up with MENZ,MICHAEL, MD. Schedule an appointment as soon as possible for a visit on 01/22/2015.   Specialty:  Orthopedic Surgery   Why:  For staple removal and skin check   Contact information:   [address] [place] [address] [phone]        Signed: Patience Musca 01/10/2015, 8:37 AM

## 2015-02-07 ENCOUNTER — Encounter: Payer: Self-pay | Admitting: Emergency Medicine

## 2015-02-07 ENCOUNTER — Emergency Department
Admission: EM | Admit: 2015-02-07 | Discharge: 2015-02-07 | Disposition: A | Discharge status: Home / Self Care | Payer: Medicaid Other | Attending: Emergency Medicine | Admitting: Emergency Medicine

## 2015-02-07 DIAGNOSIS — Z791 Long term (current) use of non-steroidal anti-inflammatories (NSAID): Secondary | ICD-10-CM | POA: Insufficient documentation

## 2015-02-07 DIAGNOSIS — I10 Essential (primary) hypertension: Secondary | ICD-10-CM | POA: Insufficient documentation

## 2015-02-07 DIAGNOSIS — Z79899 Other long term (current) drug therapy: Secondary | ICD-10-CM | POA: Insufficient documentation

## 2015-02-07 DIAGNOSIS — Z7901 Long term (current) use of anticoagulants: Secondary | ICD-10-CM | POA: Insufficient documentation

## 2015-02-07 DIAGNOSIS — Z720 Tobacco use: Secondary | ICD-10-CM | POA: Insufficient documentation

## 2015-02-07 DIAGNOSIS — L03811 Cellulitis of head [any part, except face]: Secondary | ICD-10-CM | POA: Insufficient documentation

## 2015-02-07 DIAGNOSIS — L02811 Cutaneous abscess of head [any part, except face]: Secondary | ICD-10-CM | POA: Insufficient documentation

## 2015-02-07 MED ORDER — SULFAMETHOXAZOLE-TRIMETHOPRIM 800-160 MG PO TABS: 1.0000 | ORAL_TABLET | Freq: Two times a day (BID) | ORAL | Status: AC

## 2015-02-07 MED ORDER — TRAMADOL HCL 50 MG PO TABS: 50.0000 mg | ORAL_TABLET | Freq: Two times a day (BID) | ORAL | Status: AC

## 2015-02-07 NOTE — Discharge Instructions (Signed)
Cellulitis Cellulitis is an infection of the skin and the tissue beneath it. The infected area is usually red and tender. Cellulitis occurs most often in the arms and lower legs.  CAUSES  Cellulitis is caused by bacteria that enter the skin through cracks or cuts in the skin. The most common types of bacteria that cause cellulitis are staphylococci and streptococci. SIGNS AND SYMPTOMS   Redness and warmth.  Swelling.  Tenderness or pain.  Fever. DIAGNOSIS  Your health care provider can usually determine what is wrong based on a physical exam. Blood tests may also be done. TREATMENT  Treatment usually involves taking an antibiotic medicine. HOME CARE INSTRUCTIONS   Take your antibiotic medicine as directed by your health care provider. Finish the antibiotic even if you start to feel better.  Keep the infected arm or leg elevated to reduce swelling.  Apply a warm cloth to the affected area up to 4 times per day to relieve pain.  Take medicines only as directed by your health care provider.  Keep all follow-up visits as directed by your health care provider. SEEK MEDICAL CARE IF:   You notice red streaks coming from the infected area.  Your red area gets larger or turns dark in color.  Your bone or joint underneath the infected area becomes painful after the skin has healed.  Your infection returns in the same area or another area.  You notice a swollen bump in the infected area.  You develop new symptoms.  You have a fever. SEEK IMMEDIATE MEDICAL CARE IF:   You feel very sleepy.  You develop vomiting or diarrhea.  You have a general ill feeling (malaise) with muscle aches and pains.   This information is not intended to replace advice given to you by your health care provider. Make sure you discuss any questions you have with your health care provider.   Document Released: 01/14/2005 Document Revised: 12/26/2014 Document Reviewed: 06/22/2011 Elsevier Interactive  Patient Education [employer]2016 [employer]  Keep the wound clean, dry, and covered as needed. Apply warm compresses to promote healing. Avoid shaving the are until it is healed.

## 2015-02-07 NOTE — ED Provider Notes (Signed)
[facility] Emergency Department Provider Note ____________________________________________  Time seen: 1105   I have reviewed the triage vital signs and the nursing notes.  HISTORY  Chief Complaint  Abscess  HPI Logan Mcbride is a 39 y.o. male presents to the ED for evaluation of a painful knot to the back of his head. He notes since Monday he noticed some increased tenderness to the back of the scalp, and when he shaved his head, he noted a small tender bump. His wife attempted to squeeze it, and from that time and it is some spread of the redness away from bump. He denies any interim fever, chills, or sweats.He rates his pain at a 10/10 in triage.  Past Medical History  Diagnosis Date  . Arthritis   . Smoker   . Hip pain   . Hypertension     borderline on no meds    Patient Active Problem List   Diagnosis Date Noted  . Avascular necrosis of bone of hip (HCC) 01/08/2015    Past Surgical History  Procedure Laterality Date  . Orthopedic surgery    . Hip fracture surgery    . Shoulder arthroscopy Left   . Pelvis debridement      pins inserted 5-6  . Total hip arthroplasty Left 01/08/2015    Procedure: TOTAL HIP ARTHROPLASTY ANTERIOR APPROACH;  Surgeon: Kennedy Bucker, MD;  Location: [facility] ORS;  Service: Orthopedics;  Laterality: Left;    Current Outpatient Rx  Name  Route  Sig  Dispense  Refill  . cyclobenzaprine (FLEXERIL) 10 MG tablet      Take 1/2 by mouth every 8 hours during the day and 1 in pm as needed for back pain         . diclofenac (VOLTAREN) 75 MG EC tablet   Oral   Take 1 tablet (75 mg total) by mouth 2 (two) times daily.   12 tablet   0   . enoxaparin (LOVENOX) 40 MG/0.4ML injection   Subcutaneous   Inject 0.4 mLs (40 mg total) into the skin daily.   14 Syringe   0   . meloxicam (MOBIC) 15 MG tablet   Oral   Take 15 mg by mouth daily.         Marland Kitchen oxyCODONE (OXY IR/ROXICODONE) 5 MG immediate release tablet   Oral   Take  1-2 tablets (5-10 mg total) by mouth every 3 (three) hours as needed for breakthrough pain.   60 tablet   0   . sulfamethoxazole-trimethoprim (BACTRIM DS,SEPTRA DS) 800-160 MG tablet   Oral   Take 1 tablet by mouth 2 (two) times daily.   20 tablet   0   . traMADol (ULTRAM) 50 MG tablet   Oral   Take 50 mg by mouth every 6 (six) hours as needed for moderate pain.         . traMADol (ULTRAM) 50 MG tablet   Oral   Take 1 tablet (50 mg total) by mouth 2 (two) times daily.   10 tablet   0    Allergies Review of patient's allergies indicates no known allergies.  No family history on file.  Social History Social History  Substance Use Topics  . Smoking status: Current Every Day Smoker -- 1.00 packs/day for 20 years    Types: Cigarettes  . Smokeless tobacco: None  . Alcohol Use: Yes     Comment: daily-beer   Review of Systems  Constitutional: Negative for fever. Eyes: Negative  for visual changes. ENT: Negative for sore throat. Cardiovascular: Negative for chest pain. Respiratory: Negative for shortness of breath. Gastrointestinal: Negative for abdominal pain, vomiting and diarrhea. Genitourinary: Negative for dysuria. Musculoskeletal: Negative for back pain. Skin: Negative for rash. Tender area to the back of the scalp as above. Neurological: Negative for headaches, focal weakness or numbness. ____________________________________________  PHYSICAL EXAM:  VITAL SIGNS: ED Triage Vitals  Enc Vitals Group     BP 02/07/15 1013 139/94 mmHg     Pulse Rate 02/07/15 1013 77     Resp 02/07/15 1013 14     Temp 02/07/15 1013 98.4 F (36.9 C)     Temp Source 02/07/15 1013 Oral     SpO2 02/07/15 1013 97 %     Weight 02/07/15 1013 205 lb (92.987 kg)     Height 02/07/15 1013 6' (1.829 m)     Head Cir --      Peak Flow --      Pain Score 02/07/15 1013 10     Pain Loc --      Pain Edu? --      Excl. in GC? --    Constitutional: Alert and oriented. Well appearing and in  no distress. Head: Normocephalic and atraumatic.      Eyes: Conjunctivae are normal. PERRL. Normal extraocular movements      Ears: Canals clear. TMs intact bilaterally.   Nose: No congestion/rhinorrhea.   Mouth/Throat: Mucous membranes are moist.   Neck: Supple. No thyromegaly. Hematological/Lymphatic/Immunological: No cervical lymphadenopathy. Cardiovascular: Normal rate, regular rhythm.  Respiratory: Normal respiratory effort. No wheezes/rales/rhonchi. Gastrointestinal: Soft and nontender. No distention. Musculoskeletal: Nontender with normal range of motion in all extremities.  Neurologic:  Normal gait without ataxia. Normal speech and language. No gross focal neurologic deficits are appreciated. Skin:  Skin is warm, dry and intact. No rash noted. Posterior scalp with a small pustule with a central scab intact. There is some minimal appreciable induration some extension to about a centimeter in diameter. Psychiatric: Mood and affect are normal. Patient exhibits appropriate insight and judgment. ____________________________________________  INITIAL IMPRESSION / ASSESSMENT AND PLAN / ED COURSE  Patient with a posterior scalp cellulitis with superficial abscess formation. He is encouraged to apply warm compresses to promote healing. He is given instructions on wound care management and follow-up retractions. He will dose the Bactrim as prescribed. [facility]. Ultram #10 is provided for interim pain relief. ____________________________________________  FINAL CLINICAL IMPRESSION(S) / ED DIAGNOSES  Final diagnoses:  Cellulitis and abscess of head      Lissa HoardJenise V Bacon Kaisley Stiverson, PA-C 02/07/15 1132  Sharman CheekPhillip Stafford, MD 02/07/15 1539

## 2015-02-07 NOTE — ED Notes (Signed)
Says bump on back of head.  ? Bug bite. Painful.  Says had hip replacement 3 week ago and test pos for mrsa.

## 2015-02-07 NOTE — ED Notes (Signed)
Possible abscess area to back of head .Marland Kitchen.noticed area about 2-3 days ago

## 2016-10-06 IMAGING — CR DG HIP (WITH PELVIS) OPERATIVE*L*
4 series · 4 of 4 positions shown · non-contrast
Comparison: None.

CLINICAL DATA: Degenerative joint disease of the left hip.

EXAM:
OPERATIVE left HIP (WITH PELVIS IF PERFORMED) 4 VIEWS
TECHNIQUE: Fluoroscopic spot image(s) were submitted for interpretation
post-operatively.
FLUOROSCOPY TIME:  37 seconds.

[cont. (1 of 4)]
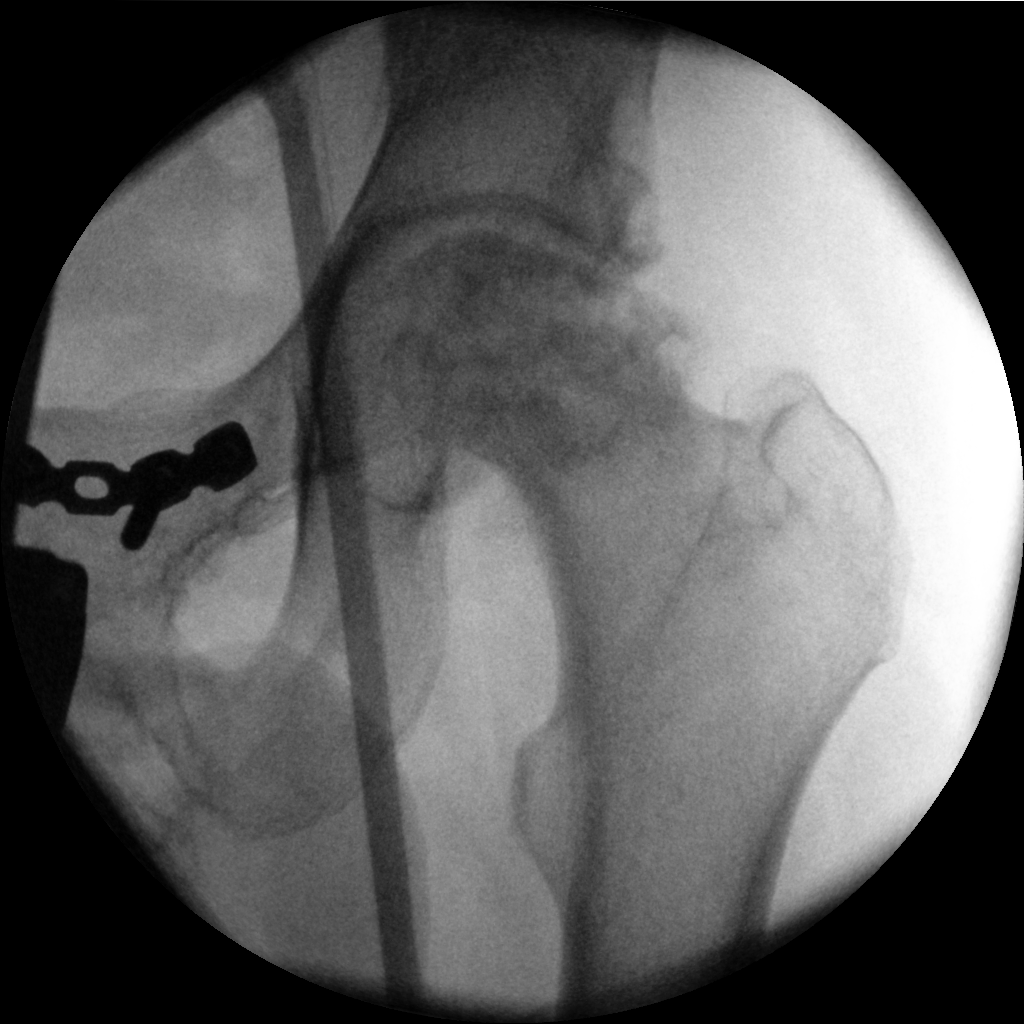

[cont. (2 of 4)]
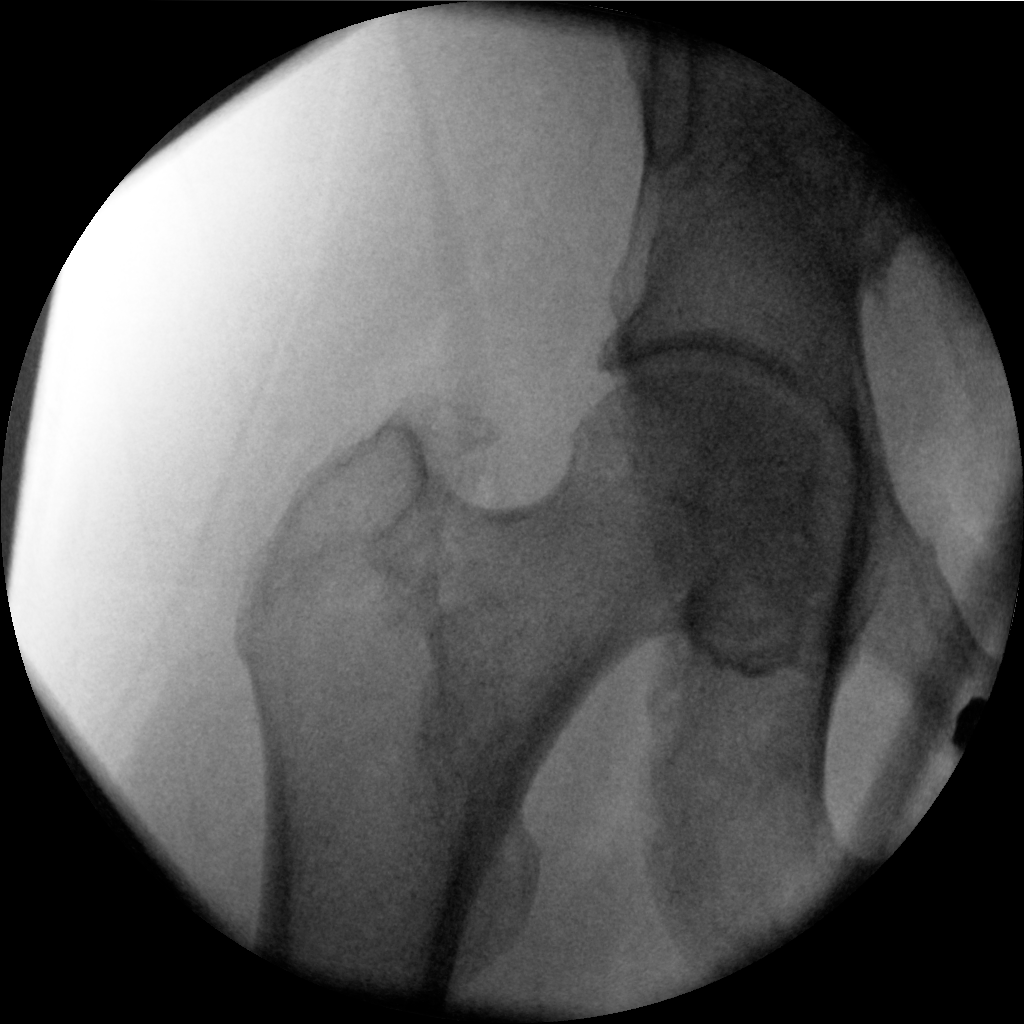

[cont. (3 of 4)]
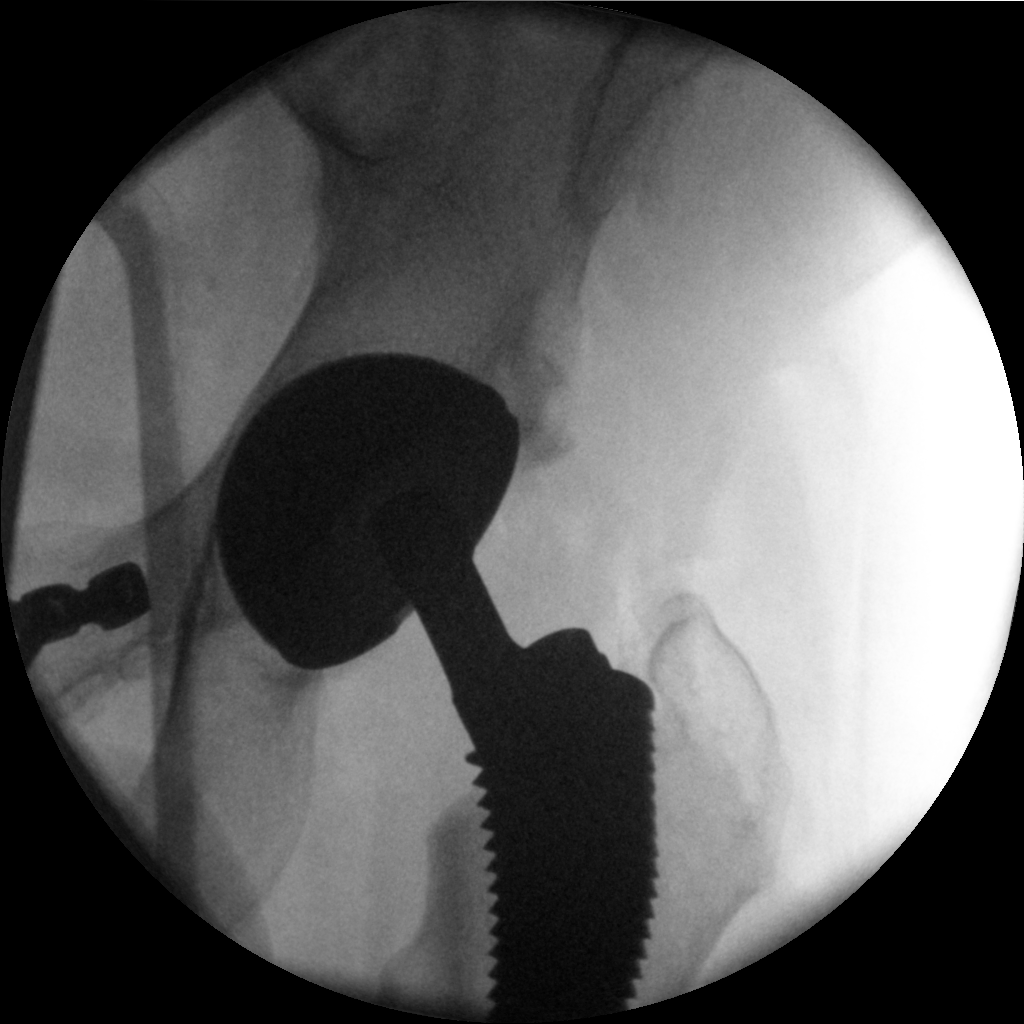

[cont. (4 of 4)]
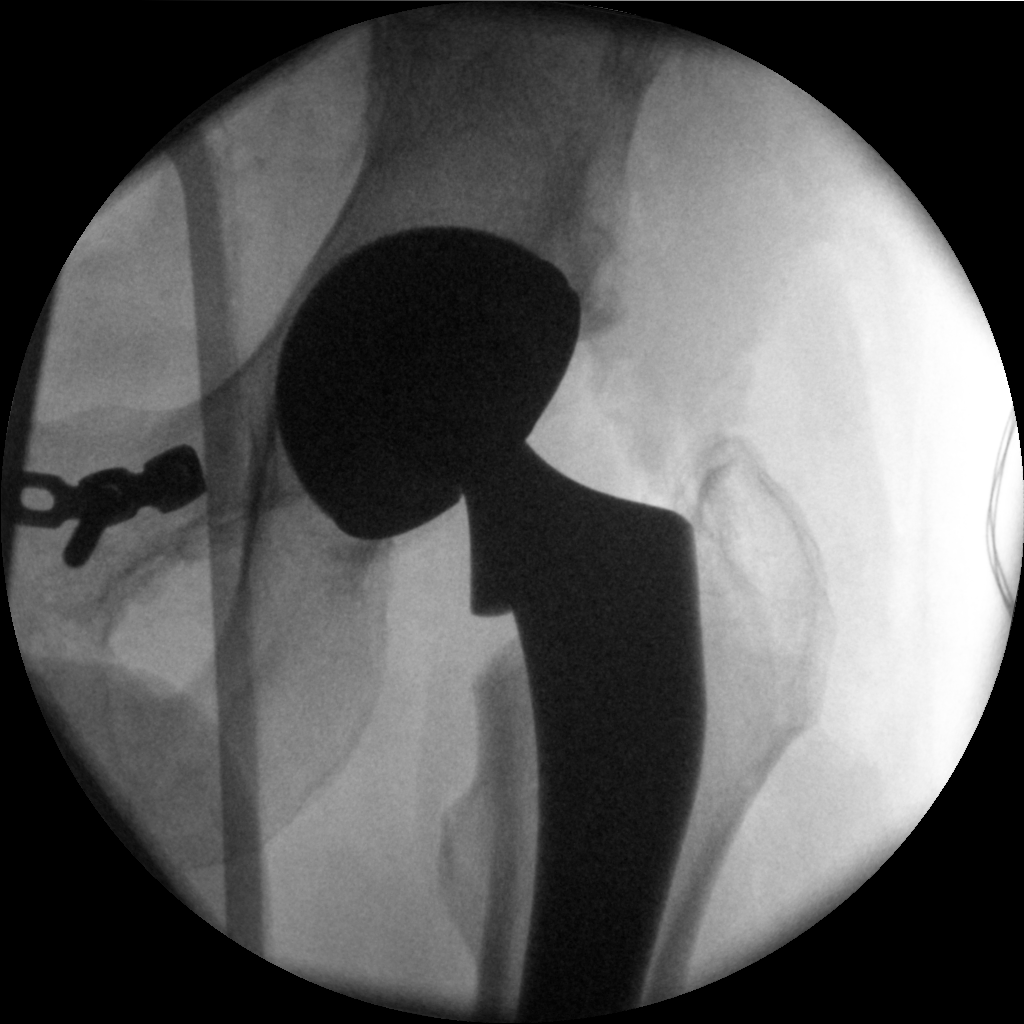

[4 of 4 positions shown; findings below may reference images not displayed]

FINDINGS: These images demonstrate placement of left hip arthroplasty for
treatment of avascular necrosis and severe degenerative joint
disease. The prosthesis appears to be well situated. Right hip
appears grossly normal.
IMPRESSION: Status post left hip arthroplasty.

## 2020-08-26 ENCOUNTER — Other Ambulatory Visit: Payer: Self-pay

## 2020-08-26 ENCOUNTER — Encounter (HOSPITAL_COMMUNITY): Payer: Self-pay

## 2020-08-26 ENCOUNTER — Emergency Department (HOSPITAL_COMMUNITY)
Admission: EM | Admit: 2020-08-26 | Discharge: 2020-08-26 | Disposition: A | Discharge status: Home / Self Care | Payer: Medicaid Other | Attending: Emergency Medicine | Admitting: Emergency Medicine

## 2020-08-26 DIAGNOSIS — Z79899 Other long term (current) drug therapy: Secondary | ICD-10-CM | POA: Insufficient documentation

## 2020-08-26 DIAGNOSIS — Z96642 Presence of left artificial hip joint: Secondary | ICD-10-CM | POA: Insufficient documentation

## 2020-08-26 DIAGNOSIS — W260XXA Contact with knife, initial encounter: Secondary | ICD-10-CM | POA: Diagnosis not present

## 2020-08-26 DIAGNOSIS — I10 Essential (primary) hypertension: Secondary | ICD-10-CM | POA: Diagnosis not present

## 2020-08-26 DIAGNOSIS — S6992XA Unspecified injury of left wrist, hand and finger(s), initial encounter: Secondary | ICD-10-CM | POA: Diagnosis present

## 2020-08-26 DIAGNOSIS — S61211A Laceration without foreign body of left index finger without damage to nail, initial encounter: Secondary | ICD-10-CM

## 2020-08-26 DIAGNOSIS — F17210 Nicotine dependence, cigarettes, uncomplicated: Secondary | ICD-10-CM | POA: Insufficient documentation

## 2020-08-26 MED ORDER — LIDOCAINE HCL (PF) 1 % IJ SOLN
15.0000 mL | Freq: Once | INTRAMUSCULAR | Status: AC
  Administered 2020-08-26: 15 mL
  Filled 2020-08-26: qty 30

## 2020-08-26 NOTE — ED Notes (Signed)
Verbal consent for MSE. Pt actively bleeding.

## 2020-08-26 NOTE — ED Triage Notes (Signed)
Pt presents to ED with laceration to left index finger from weed eater. Last tetanus within last 5 years.

## 2020-08-26 NOTE — ED Notes (Signed)
Tube dressing and finger splint applied.  Pt has good pulses and sensation.

## 2020-08-26 NOTE — ED Notes (Signed)
Pt awaiting provider, questioning when he will be seen.  Pt updated on status.

## 2020-08-26 NOTE — ED Provider Notes (Signed)
[facility] EMERGENCY DEPARTMENT Provider Note   CSN: [id number] Arrival date & time: 08/26/20  1032     History Chief Complaint  Patient presents with  . Finger Injury    Logan Mcbride is a 45 y.o. male who presents with concern for laceration to the left index finger after accidentally cutting it with a knife while attempting to trim weedeater wire.  He denies any numbness, tingling in his finger, has full range of motion though does have pain with flexion of the finger.  Patient states that the wound bled significantly initially, he is not on anticoagulation.  Patient states tetanus shot was most recently administered 1 year ago.  I personally reviewed this patient's medical records.  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  He is not on any anticoagulation.  HPI     Past Medical History:  Diagnosis Date  . Arthritis   . Hip pain   . Hypertension    borderline on no meds  . Smoker     Patient Active Problem List   Diagnosis Date Noted  . Avascular necrosis of bone of hip (HCC) 01/08/2015    Past Surgical History:  Procedure Laterality Date  . HIP FRACTURE SURGERY    . ORTHOPEDIC SURGERY    . PELVIS DEBRIDEMENT     pins inserted 5-6  . SHOULDER ARTHROSCOPY Left   . TOTAL HIP ARTHROPLASTY Left 01/08/2015   Procedure: TOTAL HIP ARTHROPLASTY ANTERIOR APPROACH;  Surgeon: Kennedy Bucker, MD;  Location: [facility] ORS;  Service: Orthopedics;  Laterality: Left;       No family history on file.  Social History   Tobacco Use  . Smoking status: Current Every Day Smoker    Packs/day: 1.00    Years: 20.00    Pack years: 20.00    Types: Cigarettes  Substance Use Topics  . Alcohol use: Yes    Comment: daily-beer  . Drug use: Yes    Types: Marijuana    Comment: last use 1 week ago.     Home Medications Prior to Admission medications   Medication Sig  Start Date End Date Taking? Authorizing Provider  cyclobenzaprine (FLEXERIL) 10 MG tablet Take 1/2 by mouth every 8 hours during the day and 1 in pm as needed for back pain    [provider]  diclofenac (VOLTAREN) 75 MG EC tablet Take 1 tablet (75 mg total) by mouth 2 (two) times daily. 05/03/13   Ivery Quale, PA-C  enoxaparin (LOVENOX) 40 MG/0.4ML injection Inject 0.4 mLs (40 mg total) into the skin daily. 01/10/15   Evon Slack, PA-C  meloxicam (MOBIC) 15 MG tablet Take 15 mg by mouth daily.    [provider]  oxyCODONE (OXY IR/ROXICODONE) 5 MG immediate release tablet Take 1-2 tablets (5-10 mg total) by mouth every 3 (three) hours as needed for breakthrough pain. 01/10/15   Evon Slack, PA-C  sulfamethoxazole-trimethoprim (BACTRIM DS,SEPTRA DS) 800-160 MG tablet Take 1 tablet by mouth 2 (two) times daily. 02/07/15   Menshew, Charlesetta Ivory, PA-C  traMADol (ULTRAM) 50 MG tablet Take 50 mg by mouth every 6 (six) hours as needed for moderate pain.    [provider]  traMADol (ULTRAM) 50 MG tablet Take 1 tablet (50 mg total) by mouth 2 (two) times daily. 02/07/15   Menshew, Charlesetta Ivory, PA-C    Allergies  Patient has no known allergies.  Review of Systems   Review of Systems  Constitutional: Negative.   HENT: Negative.   Respiratory: Negative.   Cardiovascular: Negative.   Skin: Positive for wound.       Left index finger  Hematological: Negative.     Physical Exam Updated Vital Signs BP 132/88 (BP Location: Right Arm)   Pulse 60   Temp 98.5 F (36.9 C) (Oral)   Resp 16   Ht 6' (1.829 m)   Wt 81.6 kg   SpO2 100%   BMI 24.41 kg/m   Physical Exam Vitals and nursing note reviewed.  HENT:     Head: Normocephalic and atraumatic.  Eyes:     General: No scleral icterus.       Right eye: No discharge.        Left eye: No discharge.     Conjunctiva/sclera: Conjunctivae normal.  Cardiovascular:     Pulses:          Radial pulses are  2+ on the right side and 2+ on the left side.  Pulmonary:     Effort: Pulmonary effort is normal.  Musculoskeletal:     Right wrist: Normal.     Left wrist: Normal.     Right hand: Normal.     Left hand: Laceration and tenderness present. No swelling or bony tenderness. Normal capillary refill. Normal pulse.       Hands:     Comments: Full range of motion of the left hand including the index finger, there is pain elicited with flexion of the finger.  Normal cap refill in all digits of the left hand, normal radial pulse.  Skin:    General: Skin is warm and dry.     Capillary Refill: Capillary refill takes less than 2 seconds.  Neurological:     General: No focal deficit present.     Mental Status: He is alert.  Psychiatric:        Mood and Affect: Mood normal.     ED Results / Procedures / Treatments   Labs (all labs ordered are listed, but only abnormal results are displayed) Labs Reviewed - No data to display  EKG None  Radiology No results found.  Procedures .Marland KitchenLaceration Repair  Date/Time: 08/26/2020 1:38 PM Performed by: Paris Lore, PA-C Authorized by: Paris Lore, PA-C   Consent:    Consent obtained:  Verbal   Consent given by:  Patient   Risks discussed:  Infection, need for additional repair, pain, poor cosmetic result and poor wound healing   Alternatives discussed:  No treatment and delayed treatment Universal protocol:    Procedure explained and questions answered to patient or proxy's satisfaction: yes     Relevant documents present and verified: yes     Test results available: yes     Site/side marked: yes     Immediately prior to procedure, a time out was called: yes     Patient identity confirmed:  Verbally with patient Anesthesia:    Anesthesia method:  Nerve block   Block location:  Left index finger   Block needle gauge:  25 G   Block anesthetic:  Lidocaine 1% w/o epi   Block technique:  Digital block   Block injection  procedure:  Anatomic landmarks identified, introduced needle, negative aspiration for blood and incremental injection   Block outcome:  Anesthesia achieved Laceration details:    Location:  Finger   Finger location:  L index finger  Length (cm):  2.5 Pre-procedure details:    Preparation:  Patient was prepped and draped in usual sterile fashion Exploration:    Limited defect created (wound extended): no     Hemostasis achieved with:  Direct pressure   Imaging outcome: foreign body not noted     Wound extent: no foreign bodies/material noted, no nerve damage noted, no tendon damage noted and no underlying fracture noted     Contaminated: no   Treatment:    Area cleansed with:  Betadine and saline   Amount of cleaning:  Standard   Irrigation solution:  Sterile saline   Irrigation volume:  500   Irrigation method:  Pressure wash   Visualized foreign bodies/material removed: yes   Skin repair:    Repair method:  Sutures   Suture size:  5-0   Suture material:  Prolene   Suture technique:  Simple interrupted   Number of sutures:  4 Approximation:    Approximation:  Close Repair type:    Repair type:  Intermediate Post-procedure details:    Dressing:  Antibiotic ointment, non-adherent dressing and splint for protection   Procedure completion:  Tolerated well, no immediate complications    Medications Ordered in ED Medications  lidocaine (PF) (XYLOCAINE) 1 % injection 15 mL (15 mLs Other Given 08/26/20 1246)    ED Course  I have reviewed the triage vital signs and the nursing notes.  Pertinent labs & imaging results that were available during my care of the patient were reviewed by me and considered in my medical decision making (see chart for details).    MDM Rules/Calculators/A&P                         45 year old male with laceration to the left index finger.  Up-to-date on tetanus vaccine.  Hypertensive on intake, vital signs otherwise normal.  Physical exam revealed  2.5 cm laceration to the left index finger on the volar aspect, oozing blood.  Normal cap refill and sensation in the distal finger.  Wound repaired with sutures as above.  Patient tolerated procedure well, wound was hemostatic.  Patient placed in sterile dressing and finger splint for protection.  No further work-up warranted in the ED at this time.  Sutures will be removed in 10 to 14 days  Saquan voiced understanding of his medical evaluation and treatment plan. Each of his questions was answered to his expressed satisfaction.  Strict return precautions are given.  Patient is stable and appropriate for discharge at this time.  This chart was dictated using voice recognition software, Dragon. Despite the best efforts of this provider to proofread and correct errors, errors may still occur which can change documentation meaning.  Final Clinical Impression(s) / ED Diagnoses Final diagnoses:  None    Rx / DC Orders ED Discharge Orders    None       Sherrilee Gilles 08/26/20 1342    Cathren Laine, MD 08/26/20 [phone]

## 2020-08-26 NOTE — Discharge Instructions (Addendum)
You were seen in the emergency department today for the laceration to your left index finger.  Your wound was thoroughly cleaned in the emergency department without signs of contamination.  It was repaired using 4 sutures which will not dissolve on their own.  They will need to be removed within 10 to 14 days.  You may present to your primary care doctor, urgent care, or emergency department for suture removal.  Return to the emergency department sooner if develop any redness, swelling, drainage that looks like pus from your wound, or redness or pain extending up your hand and your arm.  These can be signs of infection that would require antibiotic treatment.  You informed us that your last tetanus shot was in the last 1 to 2 years, so was not updated today.  Return to the ER if develop any new severe symptoms.
